# Patient Record
Sex: Male | Born: 1965
Health system: Southern US, Community
[De-identification: ages and names within clinical notes are randomized; demographics above are authoritative.]

## PROBLEM LIST (undated history)

## (undated) DIAGNOSIS — I2510 Atherosclerotic heart disease of native coronary artery without angina pectoris: Secondary | ICD-10-CM

## (undated) HISTORY — PX: HERNIA REPAIR: SHX51

## (undated) HISTORY — DX: Atherosclerotic heart disease of native coronary artery without angina pectoris: I25.10

## (undated) HISTORY — PX: MANDIBLE FRACTURE SURGERY: SHX706

---

## 2000-06-12 ENCOUNTER — Ambulatory Visit (HOSPITAL_COMMUNITY): Admission: RE | Admit: 2000-06-12 | Discharge: 2000-06-12 | Payer: Self-pay

## 2003-10-11 ENCOUNTER — Emergency Department (HOSPITAL_COMMUNITY): Admission: AD | Admit: 2003-10-11 | Discharge: 2003-10-11 | Payer: Self-pay | Admitting: Family Medicine

## 2003-12-30 ENCOUNTER — Encounter: Admission: RE | Admit: 2003-12-30 | Discharge: 2003-12-30 | Payer: Self-pay | Admitting: Orthopedic Surgery

## 2006-01-19 ENCOUNTER — Emergency Department (HOSPITAL_COMMUNITY): Admission: EM | Admit: 2006-01-19 | Discharge: 2006-01-19 | Payer: Self-pay | Admitting: Family Medicine

## 2006-02-07 ENCOUNTER — Emergency Department (HOSPITAL_COMMUNITY): Admission: EM | Admit: 2006-02-07 | Discharge: 2006-02-07 | Payer: Self-pay | Admitting: Emergency Medicine

## 2006-06-20 ENCOUNTER — Emergency Department (HOSPITAL_COMMUNITY): Admission: EM | Admit: 2006-06-20 | Discharge: 2006-06-20 | Payer: Self-pay | Admitting: Family Medicine

## 2007-08-09 ENCOUNTER — Emergency Department (HOSPITAL_COMMUNITY): Admission: EM | Admit: 2007-08-09 | Discharge: 2007-08-09 | Payer: Self-pay | Admitting: Family Medicine

## 2008-09-12 ENCOUNTER — Emergency Department (HOSPITAL_COMMUNITY): Admission: EM | Admit: 2008-09-12 | Discharge: 2008-09-12 | Payer: Self-pay | Admitting: Emergency Medicine

## 2013-08-15 ENCOUNTER — Encounter (HOSPITAL_COMMUNITY): Payer: Self-pay | Admitting: Emergency Medicine

## 2013-08-15 ENCOUNTER — Emergency Department (INDEPENDENT_AMBULATORY_CARE_PROVIDER_SITE_OTHER)
Admission: EM | Admit: 2013-08-15 | Discharge: 2013-08-15 | Disposition: A | Discharge status: Home / Self Care | Payer: BC Managed Care – PPO | Source: Home / Self Care | Attending: Emergency Medicine | Admitting: Emergency Medicine

## 2013-08-15 DIAGNOSIS — J069 Acute upper respiratory infection, unspecified: Secondary | ICD-10-CM

## 2013-08-15 LAB — GLUCOSE, CAPILLARY: Glucose-Capillary: 87 mg/dL (ref 70–99)

## 2013-08-15 MED ORDER — IPRATROPIUM BROMIDE 0.06 % NA SOLN
2.0000 | Freq: Four times a day (QID) | NASAL | Status: DC
Start: 2013-08-15 — End: 2024-04-03

## 2013-08-15 NOTE — ED Notes (Signed)
Thursday noticed tickle in throat, sinus drainage, not feeling good, increased sleepinee, little coughing, "feels bad", sinus congestion, weakness, achy

## 2013-08-15 NOTE — ED Provider Notes (Signed)
CSN: [id number]     Arrival date & time 08/15/13  1055 History   First MD Initiated Contact with Patient 08/15/13 1155     Chief Complaint  Patient presents with  . URI     (Consider location/radiation/quality/duration/timing/severity/associated sxs/prior Treatment) HPI Comments: Patient presents with 5-7 day history of what he states began as a "tickle in my throat." Has had nasal congestion, occasional myalgias, generalized malaise, and mild non-productive cough over past several days. Denies fever, nausea, vomiting, diarrhea, dyspnea, rash. States he has had contact with multiple individuals both at home and at work who have been ill with same symptoms. Did receive 2014-2015 flu shot. Is a non-smoker. Denies uriniary symptoms, polyuria, polydipsia or unexplained weight loss. No sinus pain, no headaches.   The history is provided by the patient.    History reviewed. No pertinent past medical history. Past Surgical History  Procedure Laterality Date  . Mandible fracture surgery    . Hernia repair     No family history on file. History  Substance Use Topics  . Smoking status: Never Smoker   . Smokeless tobacco: Not on file  . Alcohol Use: Yes    Review of Systems  All other systems reviewed and are negative.      Allergies  Review of patient's allergies indicates no known allergies.  Home Medications  No current outpatient prescriptions on file. BP 123/77  Pulse 73  Temp(Src) 98.3 F (36.8 C) (Oral)  Resp 18  SpO2 95% Physical Exam  Nursing note and vitals reviewed. Constitutional: He is oriented to person, place, and time. He appears well-developed and well-nourished. No distress.  HENT:  Head: Normocephalic and atraumatic.  Right Ear: Hearing, tympanic membrane, external ear and ear canal normal.  Left Ear: Hearing, tympanic membrane, external ear and ear canal normal.  Nose: Nose normal.  Mouth/Throat: Uvula is midline, oropharynx is clear and moist and  mucous membranes are normal.  Eyes: Conjunctivae are normal. Right eye exhibits no discharge. Left eye exhibits no discharge. No scleral icterus.  Neck: Normal range of motion. Neck supple.  Cardiovascular: Normal rate, regular rhythm and normal heart sounds.   Pulmonary/Chest: Breath sounds normal.  Abdominal: Soft. Bowel sounds are normal. He exhibits no distension. There is no tenderness.  Musculoskeletal: Normal range of motion.  Lymphadenopathy:    He has no cervical adenopathy.  Neurological: He is alert and oriented to person, place, and time.  Skin: Skin is warm and dry.  Psychiatric: He has a normal mood and affect. His behavior is normal.    ED Course  Procedures (including critical care time) Labs Review Labs Reviewed - No data to display Imaging Review No results found.    MDM   Final diagnoses:  None  Exam unremarkable, vital signs normal and CBG normal. Will provide Rx for atrovent nasal spray for nasal congestion and advise PCP follow up if additional symptoms arise or if fatigue persists.   Jess BartersJennifer Lee OsinoPresson, GeorgiaPA 08/15/13 1245

## 2013-08-15 NOTE — Discharge Instructions (Signed)
Antibiotic Nonuse  Your caregiver felt that the infection or problem was not one that would be helped with an antibiotic. Infections may be caused by viruses or bacteria. Only a caregiver can tell which one of these is the likely cause of an illness. A cold is the most common cause of infection in both adults and children. A cold is a virus. Antibiotic treatment will have no effect on a viral infection. Viruses can lead to many lost days of work caring for sick children and many missed days of school. Children may catch as many as 10 "colds" or "flus" per year during which they can be tearful, cranky, and uncomfortable. The goal of treating a virus is aimed at keeping the ill person comfortable. Antibiotics are medications used to help the body fight bacterial infections. There are relatively few types of bacteria that cause infections but there are hundreds of viruses. While both viruses and bacteria cause infection they are very different types of germs. A viral infection will typically go away by itself within 7 to 10 days. Bacterial infections may spread or get worse without antibiotic treatment. Examples of bacterial infections are:  Sore throats (like strep throat or tonsillitis).  Infection in the lung (pneumonia).  Ear and skin infections. Examples of viral infections are:  Colds or flus.  Most coughs and bronchitis.  Sore throats not caused by Strep.  Runny noses. It is often best not to take an antibiotic when a viral infection is the cause of the problem. Antibiotics can kill off the helpful bacteria that we have inside our body and allow harmful bacteria to start growing. Antibiotics can cause side effects such as allergies, nausea, and diarrhea without helping to improve the symptoms of the viral infection. Additionally, repeated uses of antibiotics can cause bacteria inside of our body to become resistant. That resistance can be passed onto harmful bacterial. The next time you have  an infection it may be harder to treat if antibiotics are used when they are not needed. Not treating with antibiotics allows our own immune system to develop and take care of infections more efficiently. Also, antibiotics will work better for us when they are prescribed for bacterial infections. Treatments for a child that is ill may include:  Give extra fluids throughout the day to stay hydrated.  Get plenty of rest.  Only give your child over-the-counter or prescription medicines for pain, discomfort, or fever as directed by your caregiver.  The use of a cool mist humidifier may help stuffy noses.  Cold medications if suggested by your caregiver. Your caregiver may decide to start you on an antibiotic if:  The problem you were seen for today continues for a longer length of time than expected.  You develop a secondary bacterial infection. SEEK MEDICAL CARE IF:  Fever lasts longer than 5 days.  Symptoms continue to get worse after 5 to 7 days or become severe.  Difficulty in breathing develops.  Signs of dehydration develop (poor drinking, rare urinating, dark colored urine).  Changes in behavior or worsening tiredness (listlessness or lethargy). Document Released: 08/29/2001 Document Revised: 09/12/2011 Document Reviewed: 02/25/2009 [facility] Patient Information 2014 [place], [place].  Your vital signs, blood sugar and examination were all normal. I would advise using nasal spray as prescribed for your congestion. If you continue to feel fatigued or develop new or worsening symptoms over the next 7-10 days, please contact your primary care doctor for re-evaluation.

## 2013-08-16 NOTE — ED Provider Notes (Signed)
Medical screening examination/treatment/procedure(s) were performed by non-physician practitioner and as supervising physician I was immediately available for consultation/collaboration.  Leslee Homeavid Rolen Conger, M.D.  Reuben Likesavid C Dariane Natzke, MD 08/16/13 1155

## 2015-12-04 DIAGNOSIS — H5213 Myopia, bilateral: Secondary | ICD-10-CM | POA: Diagnosis not present

## 2016-07-13 DIAGNOSIS — Z0000 Encounter for general adult medical examination without abnormal findings: Secondary | ICD-10-CM | POA: Diagnosis not present

## 2016-07-13 DIAGNOSIS — E041 Nontoxic single thyroid nodule: Secondary | ICD-10-CM | POA: Diagnosis not present

## 2016-07-13 DIAGNOSIS — Z125 Encounter for screening for malignant neoplasm of prostate: Secondary | ICD-10-CM | POA: Diagnosis not present

## 2016-07-20 DIAGNOSIS — E041 Nontoxic single thyroid nodule: Secondary | ICD-10-CM | POA: Diagnosis not present

## 2016-07-20 DIAGNOSIS — Z125 Encounter for screening for malignant neoplasm of prostate: Secondary | ICD-10-CM | POA: Diagnosis not present

## 2016-07-20 DIAGNOSIS — M542 Cervicalgia: Secondary | ICD-10-CM | POA: Diagnosis not present

## 2016-07-20 DIAGNOSIS — Z1389 Encounter for screening for other disorder: Secondary | ICD-10-CM | POA: Diagnosis not present

## 2016-07-20 DIAGNOSIS — E784 Other hyperlipidemia: Secondary | ICD-10-CM | POA: Diagnosis not present

## 2016-07-20 DIAGNOSIS — R946 Abnormal results of thyroid function studies: Secondary | ICD-10-CM | POA: Diagnosis not present

## 2016-07-20 DIAGNOSIS — Z0000 Encounter for general adult medical examination without abnormal findings: Secondary | ICD-10-CM | POA: Diagnosis not present

## 2016-07-22 ENCOUNTER — Other Ambulatory Visit: Payer: Self-pay | Admitting: Internal Medicine

## 2016-07-22 DIAGNOSIS — E041 Nontoxic single thyroid nodule: Secondary | ICD-10-CM

## 2017-07-18 DIAGNOSIS — E041 Nontoxic single thyroid nodule: Secondary | ICD-10-CM | POA: Diagnosis not present

## 2017-07-18 DIAGNOSIS — Z125 Encounter for screening for malignant neoplasm of prostate: Secondary | ICD-10-CM | POA: Diagnosis not present

## 2017-07-18 DIAGNOSIS — E7849 Other hyperlipidemia: Secondary | ICD-10-CM | POA: Diagnosis not present

## 2017-07-21 DIAGNOSIS — E041 Nontoxic single thyroid nodule: Secondary | ICD-10-CM | POA: Diagnosis not present

## 2017-07-21 DIAGNOSIS — Z23 Encounter for immunization: Secondary | ICD-10-CM | POA: Diagnosis not present

## 2017-07-21 DIAGNOSIS — Z1389 Encounter for screening for other disorder: Secondary | ICD-10-CM | POA: Diagnosis not present

## 2017-07-21 DIAGNOSIS — E7849 Other hyperlipidemia: Secondary | ICD-10-CM | POA: Diagnosis not present

## 2017-07-21 DIAGNOSIS — Z0000 Encounter for general adult medical examination without abnormal findings: Secondary | ICD-10-CM | POA: Diagnosis not present

## 2017-07-21 DIAGNOSIS — R946 Abnormal results of thyroid function studies: Secondary | ICD-10-CM | POA: Diagnosis not present

## 2017-07-21 DIAGNOSIS — R740 Nonspecific elevation of levels of transaminase and lactic acid dehydrogenase [LDH]: Secondary | ICD-10-CM | POA: Diagnosis not present

## 2017-07-22 ENCOUNTER — Other Ambulatory Visit: Payer: Self-pay | Admitting: Internal Medicine

## 2017-07-22 DIAGNOSIS — E785 Hyperlipidemia, unspecified: Secondary | ICD-10-CM

## 2017-07-28 DIAGNOSIS — Z1212 Encounter for screening for malignant neoplasm of rectum: Secondary | ICD-10-CM | POA: Diagnosis not present

## 2017-12-05 DIAGNOSIS — H5213 Myopia, bilateral: Secondary | ICD-10-CM | POA: Diagnosis not present

## 2017-12-26 ENCOUNTER — Encounter: Payer: Self-pay | Admitting: Internal Medicine

## 2018-03-07 ENCOUNTER — Encounter: Payer: Self-pay | Admitting: Internal Medicine

## 2018-03-30 ENCOUNTER — Encounter: Payer: Self-pay | Admitting: Internal Medicine

## 2018-03-30 ENCOUNTER — Ambulatory Visit (AMBULATORY_SURGERY_CENTER): Payer: Self-pay | Admitting: *Deleted

## 2018-03-30 VITALS — Ht 71.5 in | Wt 216.0 lb

## 2018-03-30 DIAGNOSIS — Z1211 Encounter for screening for malignant neoplasm of colon: Secondary | ICD-10-CM

## 2018-03-30 MED ORDER — NA SULFATE-K SULFATE-MG SULF 17.5-3.13-1.6 GM/177ML PO SOLN: 1.0000 | Freq: Once | ORAL | 0 refills | Status: AC

## 2018-03-30 NOTE — Progress Notes (Signed)
No egg or soy allergy known to patient  No issues with past sedation with any surgeries  or procedures, no intubation problems  No diet pills per patient No home 02 use per patient  No blood thinners per patient  Pt denies issues with constipation  No A fib or A flutter  EMMI video sent to pt's e mail  Pt dclined  PNM than $50 coupon to pt in PV today for Suprep

## 2018-04-11 ENCOUNTER — Ambulatory Visit (AMBULATORY_SURGERY_CENTER): Payer: BLUE CROSS/BLUE SHIELD | Admitting: Internal Medicine

## 2018-04-11 ENCOUNTER — Encounter: Payer: Self-pay | Admitting: Internal Medicine

## 2018-04-11 VITALS — BP 112/72 | HR 66 | Temp 98.0°F | Resp 14 | Ht 71.5 in | Wt 216.0 lb

## 2018-04-11 DIAGNOSIS — Z1211 Encounter for screening for malignant neoplasm of colon: Secondary | ICD-10-CM

## 2018-04-11 MED ORDER — SODIUM CHLORIDE 0.9 % IV SOLN: 500.0000 mL | Freq: Once | INTRAVENOUS | Status: DC

## 2018-04-11 NOTE — Progress Notes (Signed)
Pt's states no medical or surgical changes since previsit or office visit. 

## 2018-04-11 NOTE — Progress Notes (Signed)
Report to PACU, RN, vss, BBS= Clear.  

## 2018-04-11 NOTE — Op Note (Signed)
[facility] Patient Name: Rick Smith Procedure Date: 04/11/2018 3:19 PM MRN: [id number] Endoscopist: Wilhemina Bonito. Marina Goodell , MD Age: 52 Referring MD:  Date of Birth: 01-29-1966 Gender: Male Account #: [id number] Procedure:                Colonoscopy Indications:              Screening for colorectal malignant neoplasm Medicines:                Monitored Anesthesia Care Procedure:                Pre-Anesthesia Assessment:                           - Prior to the procedure, a History and Physical                            was performed, and patient medications and                            allergies were reviewed. The patient's tolerance of                            previous anesthesia was also reviewed. The risks                            and benefits of the procedure and the sedation                            options and risks were discussed with the patient.                            All questions were answered, and informed consent                            was obtained. Prior Anticoagulants: The patient has                            taken no previous anticoagulant or antiplatelet                            agents. ASA Grade Assessment: I - A normal, healthy                            patient. After reviewing the risks and benefits,                            the patient was deemed in satisfactory condition to                            undergo the procedure.                           After obtaining informed consent, the colonoscope  was passed under direct vision. Throughout the                            procedure, the patient's blood pressure, pulse, and                            oxygen saturations were monitored continuously. The                            Colonoscope was introduced through the anus and                            advanced to the the cecum, identified by                            appendiceal orifice and ileocecal valve. The                             ileocecal valve, appendiceal orifice, and rectum                            were photographed. The quality of the bowel                            preparation was excellent. The colonoscopy was                            performed without difficulty. The patient tolerated                            the procedure well. The bowel preparation used was                            SUPREP. Scope In: 3:27:56 PM Scope Out: 3:41:20 PM Scope Withdrawal Time: 0 hours 10 minutes 48 seconds  Total Procedure Duration: 0 hours 13 minutes 24 seconds  Findings:                 Multiple diverticula were found in the sigmoid                            colon.                           The exam was otherwise without abnormality on                            direct and retroflexion views. Complications:            No immediate complications. Estimated blood loss:                            None. Estimated Blood Loss:     Estimated blood loss: none. Impression:               - Diverticulosis in the sigmoid colon.                           -  The examination was otherwise normal on direct                            and retroflexion views.                           - No specimens collected. Recommendation:           - Repeat colonoscopy in 10 years for screening                            purposes.                           - Patient has a contact number available for                            emergencies. The signs and symptoms of potential                            delayed complications were discussed with the                            patient. Return to normal activities tomorrow.                            Written discharge instructions were provided to the                            patient.                           - Resume previous diet.                           - Continue present medications. Wilhemina Bonito. Marina Goodell, MD 04/11/2018 3:45:44 PM This report has been signed  electronically.

## 2018-04-11 NOTE — Patient Instructions (Signed)
HANDOUT GIVEN FOR DIVERTICULOSIS  YOU HAD AN ENDOSCOPIC PROCEDURE TODAY AT THE [facility]:   Refer to the procedure report that was given to you for any specific questions about what was found during the examination.  If the procedure report does not answer your questions, please call your gastroenterologist to clarify.  If you requested that your care partner not be given the details of your procedure findings, then the procedure report has been included in a sealed envelope for you to review at your convenience later.  YOU SHOULD EXPECT: Some feelings of bloating in the abdomen. Passage of more gas than usual.  Walking can help get rid of the air that was put into your GI tract during the procedure and reduce the bloating. If you had a lower endoscopy (such as a colonoscopy or flexible sigmoidoscopy) you may notice spotting of blood in your stool or on the toilet paper. If you underwent a bowel prep for your procedure, you may not have a normal bowel movement for a few days.  Please Note:  You might notice some irritation and congestion in your nose or some drainage.  This is from the oxygen used during your procedure.  There is no need for concern and it should clear up in a day or so.  SYMPTOMS TO REPORT IMMEDIATELY:   Following lower endoscopy (colonoscopy or flexible sigmoidoscopy):  Excessive amounts of blood in the stool  Significant tenderness or worsening of abdominal pains  Swelling of the abdomen that is new, acute  Fever of 100F or higher   For urgent or emergent issues, a gastroenterologist can be reached at any hour by calling [phone].   DIET:  We do recommend a small meal at first, but then you may proceed to your regular diet.  Drink plenty of fluids but you should avoid alcoholic beverages for 24 hours.  ACTIVITY:  You should plan to take it easy for the rest of today and you should NOT DRIVE or use heavy machinery until tomorrow (because of the sedation  medicines used during the test).    FOLLOW UP: Our staff will call the number listed on your records the next business day following your procedure to check on you and address any questions or concerns that you may have regarding the information given to you following your procedure. If we do not reach you, we will leave a message.  However, if you are feeling well and you are not experiencing any problems, there is no need to return our call.  We will assume that you have returned to your regular daily activities without incident.  If any biopsies were taken you will be contacted by phone or by letter within the next 1-3 weeks.  Please call us at [phone] if you have not heard about the biopsies in 3 weeks.    SIGNATURES/CONFIDENTIALITY: You and/or your care partner have signed paperwork which will be entered into your electronic medical record.  These signatures attest to the fact that that the information above on your After Visit Summary has been reviewed and is understood.  Full responsibility of the confidentiality of this discharge information lies with you and/or your care-partner. 

## 2018-04-12 ENCOUNTER — Telehealth: Payer: Self-pay

## 2018-04-12 NOTE — Telephone Encounter (Signed)
  Follow up Call-  Call back number 04/11/2018  Post procedure Call Back phone  # [phone]  Permission to leave phone message Yes  Some recent data might be hidden    Tried to leave message but mailbox is full.

## 2018-04-12 NOTE — Telephone Encounter (Signed)
  Follow up Call-  Call back number 04/11/2018  Post procedure Call Back phone  # [phone]  Permission to leave phone message Yes  Some recent data might be hidden     Patient questions:  Do you have a fever, pain , or abdominal swelling? No. Pain Score  0 *  Have you tolerated food without any problems? Yes.    Have you been able to return to your normal activities? Yes.    Do you have any questions about your discharge instructions: Diet   No. Medications  No. Follow up visit  No.  Do you have questions or concerns about your Care? No.  Actions: * If pain score is 4 or above: No action needed, pain <4.

## 2018-07-18 DIAGNOSIS — E041 Nontoxic single thyroid nodule: Secondary | ICD-10-CM | POA: Diagnosis not present

## 2018-07-18 DIAGNOSIS — Z125 Encounter for screening for malignant neoplasm of prostate: Secondary | ICD-10-CM | POA: Diagnosis not present

## 2018-07-18 DIAGNOSIS — Z0000 Encounter for general adult medical examination without abnormal findings: Secondary | ICD-10-CM | POA: Diagnosis not present

## 2018-07-25 DIAGNOSIS — E041 Nontoxic single thyroid nodule: Secondary | ICD-10-CM | POA: Diagnosis not present

## 2018-07-25 DIAGNOSIS — R946 Abnormal results of thyroid function studies: Secondary | ICD-10-CM | POA: Diagnosis not present

## 2018-07-25 DIAGNOSIS — Z0000 Encounter for general adult medical examination without abnormal findings: Secondary | ICD-10-CM | POA: Diagnosis not present

## 2018-07-25 DIAGNOSIS — Z23 Encounter for immunization: Secondary | ICD-10-CM | POA: Diagnosis not present

## 2018-07-25 DIAGNOSIS — R740 Nonspecific elevation of levels of transaminase and lactic acid dehydrogenase [LDH]: Secondary | ICD-10-CM | POA: Diagnosis not present

## 2018-07-25 DIAGNOSIS — E7849 Other hyperlipidemia: Secondary | ICD-10-CM | POA: Diagnosis not present

## 2018-07-25 DIAGNOSIS — Z1331 Encounter for screening for depression: Secondary | ICD-10-CM | POA: Diagnosis not present

## 2018-07-27 ENCOUNTER — Other Ambulatory Visit: Payer: Self-pay | Admitting: Internal Medicine

## 2018-07-27 DIAGNOSIS — E041 Nontoxic single thyroid nodule: Secondary | ICD-10-CM

## 2018-08-02 ENCOUNTER — Ambulatory Visit
Admission: RE | Admit: 2018-08-02 | Discharge: 2018-08-02 | Disposition: A | Discharge status: Home / Self Care | Payer: Self-pay | Source: Ambulatory Visit | Attending: Internal Medicine | Admitting: Internal Medicine

## 2018-08-02 DIAGNOSIS — E785 Hyperlipidemia, unspecified: Secondary | ICD-10-CM

## 2018-08-02 DIAGNOSIS — E041 Nontoxic single thyroid nodule: Secondary | ICD-10-CM

## 2018-08-06 ENCOUNTER — Other Ambulatory Visit: Payer: Self-pay | Admitting: Internal Medicine

## 2018-08-06 DIAGNOSIS — E041 Nontoxic single thyroid nodule: Secondary | ICD-10-CM

## 2018-08-21 ENCOUNTER — Ambulatory Visit
Admission: RE | Admit: 2018-08-21 | Discharge: 2018-08-21 | Disposition: A | Discharge status: Home / Self Care | Payer: BLUE CROSS/BLUE SHIELD | Source: Ambulatory Visit | Attending: Internal Medicine | Admitting: Internal Medicine

## 2018-08-21 ENCOUNTER — Other Ambulatory Visit (HOSPITAL_COMMUNITY)
Admission: RE | Admit: 2018-08-21 | Discharge: 2018-08-21 | Disposition: A | Discharge status: Home / Self Care | Payer: BLUE CROSS/BLUE SHIELD | Source: Ambulatory Visit | Attending: Radiology | Admitting: Radiology

## 2018-08-21 DIAGNOSIS — E041 Nontoxic single thyroid nodule: Secondary | ICD-10-CM

## 2019-02-01 IMAGING — CT CT HEART SCORING
3 series · 14 of 20 positions shown, 16 images · non-contrast
Comparison: None.

CLINICAL DATA: 52-YEAR-OLD MALE.  HYPERLIPIDEMIA.  CAUCASIAN.

EXAM:
CT HEART FOR CALCIUM SCORING
TECHNIQUE: CT heart was performed on a 256 channel system using prospective ECG
gating. A scout and noncontrast exam (for calcium scoring) were
performed. Note that this exam targets the heart and the chest was
not imaged in its entirety.

[Series 2: calcium scoring 2.00 qr36 bestdiast 70% · axial · 0.45mm/px · z∈[+1746,+1818]mm · 4 of 61 slices shown]
[im 13/61  vessel]
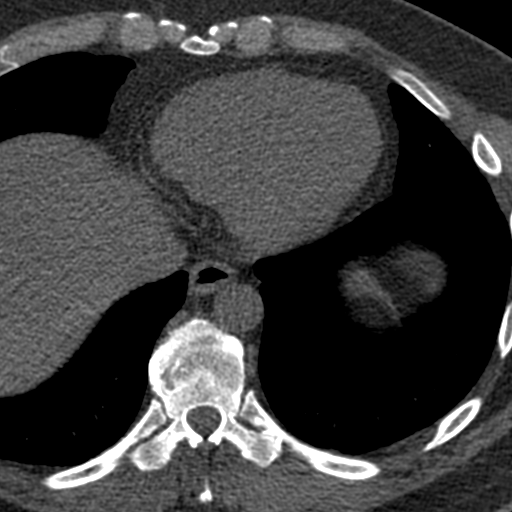
[im 25/61  vessel]
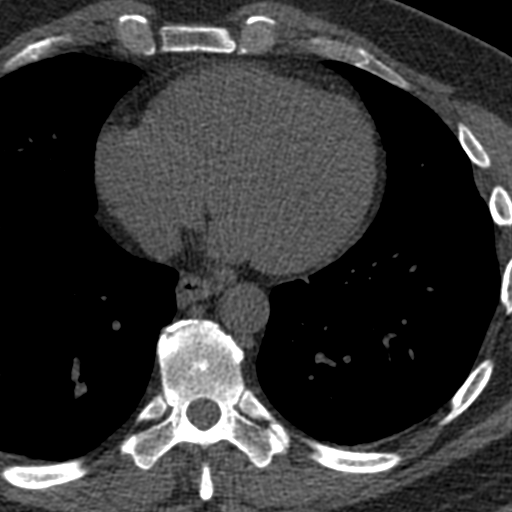
[im 37/61  vessel]
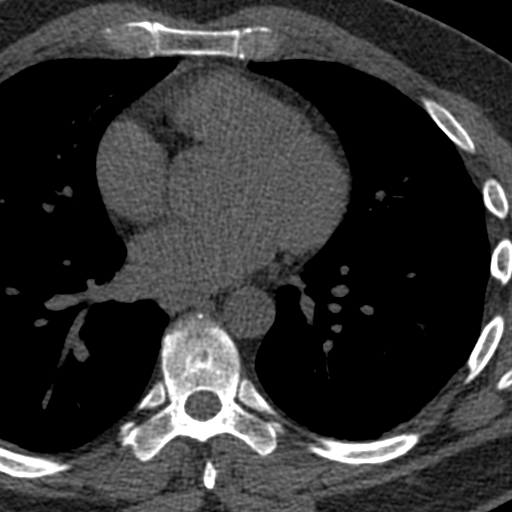
[im 49/61  vessel]
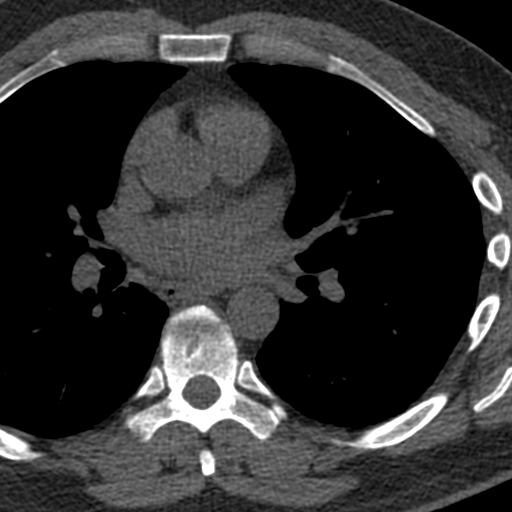

[Series 3: calcium scoring 2.00 br40 bestdiast 70% ax fov · axial · 0.60mm/px · z∈[+1742,+1822]mm · 5 of 61 slices shown, 7 images]
[im 11/61  vessel]
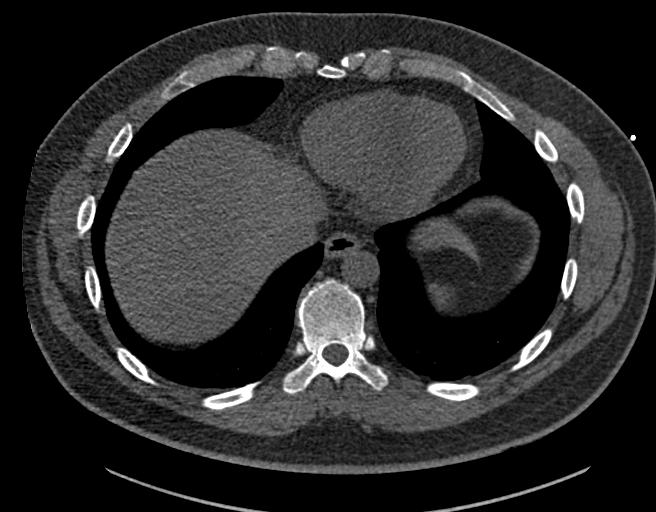
[im 11/61  lung]
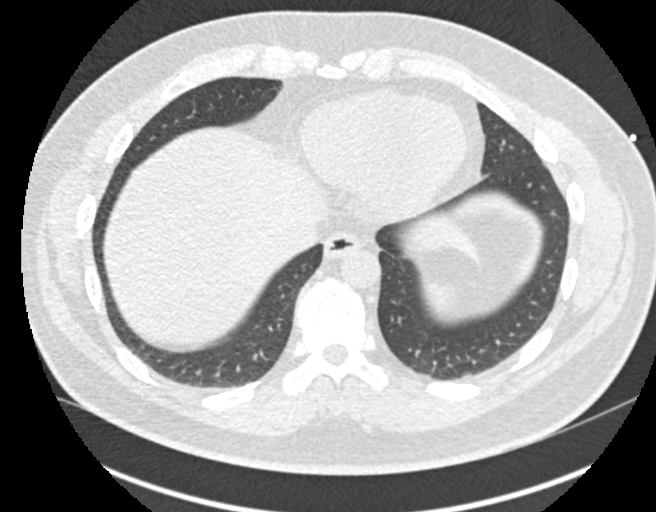
[im 21/61  vessel]
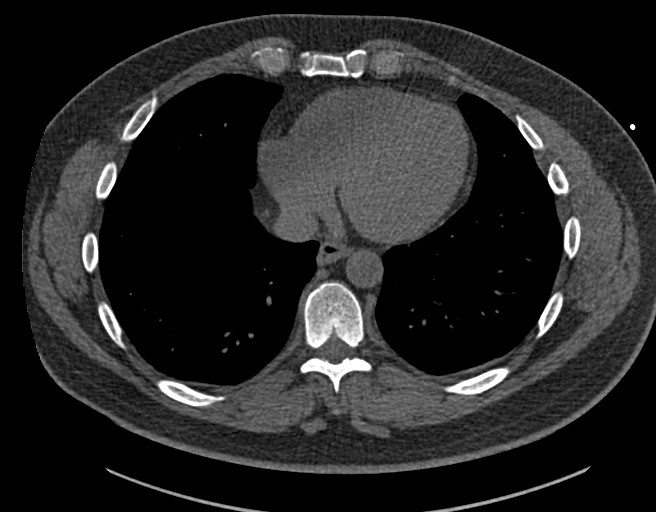
[im 31/61  vessel]
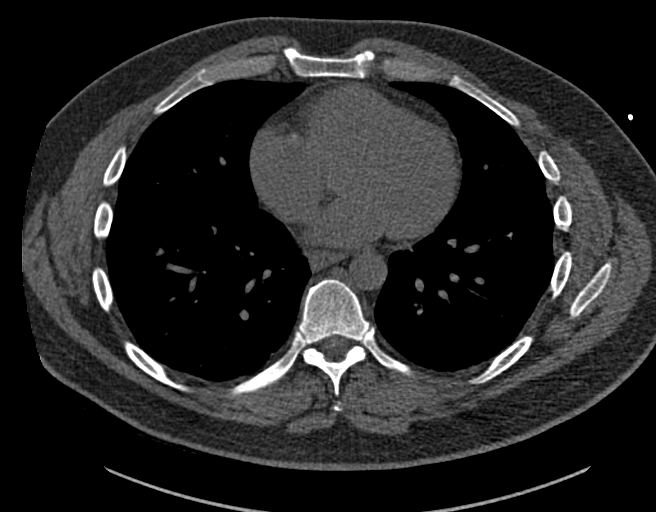
[im 41/61  vessel]
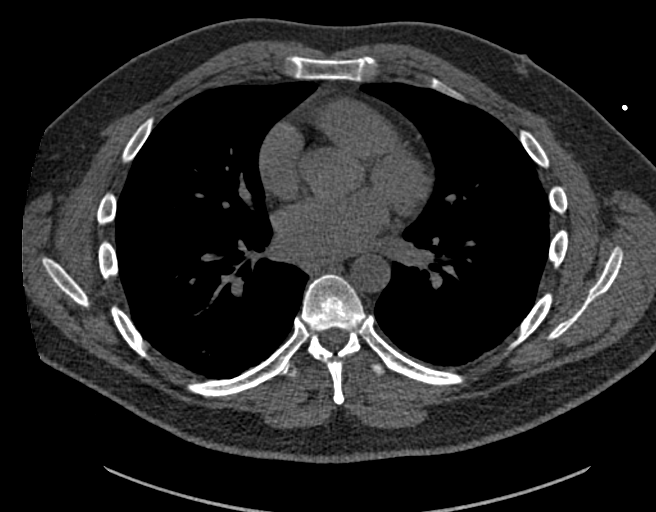
[im 51/61  vessel]
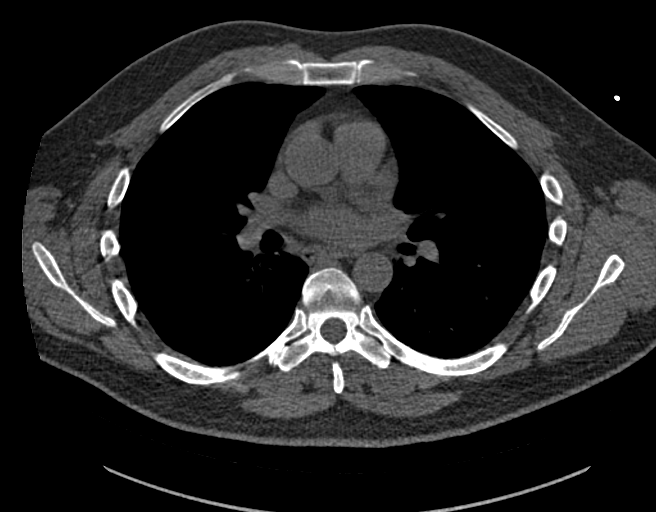
[im 51/61  lung]
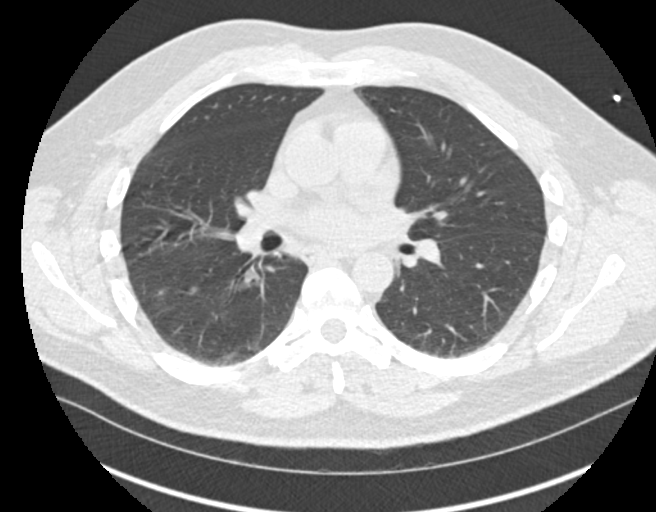

[Series 9: calcium scoring 2.00 br60 bestdiast 70% ax fov · axial · 0.60mm/px · z∈[+1742,+1822]mm · 5 of 60 slices shown]
[im 10/60  vessel]
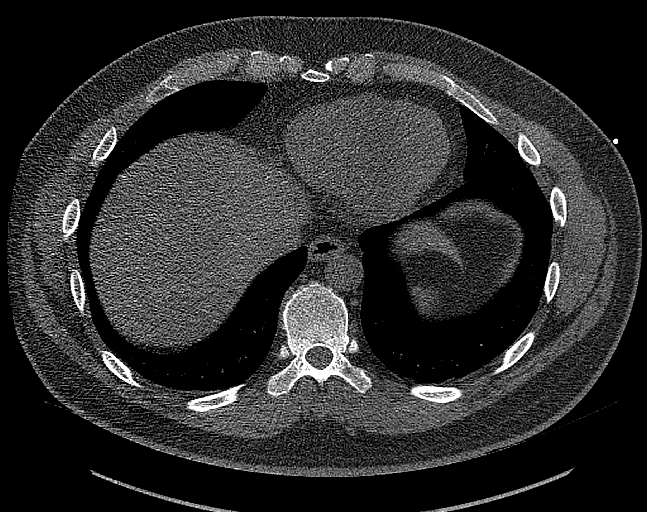
[im 20/60  vessel]
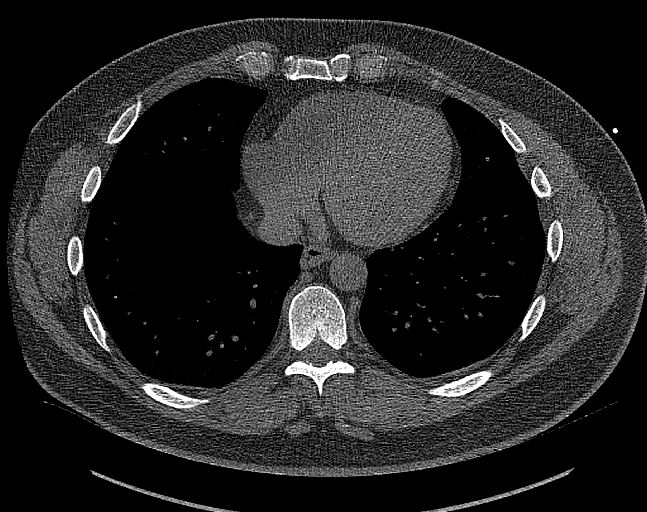
[im 30/60  vessel]
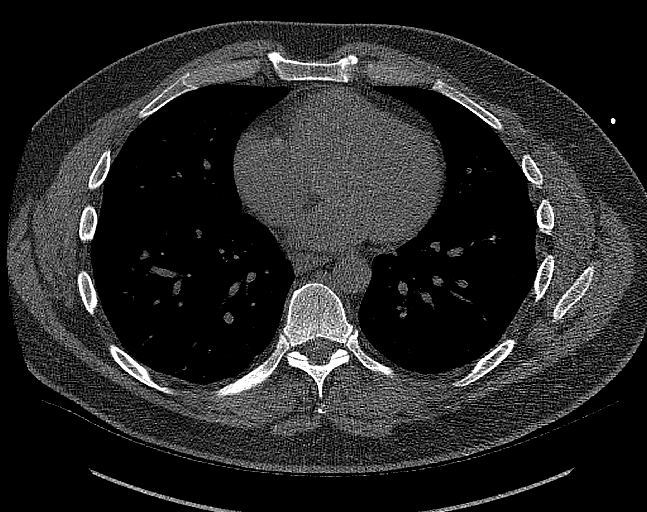
[im 40/60  vessel]
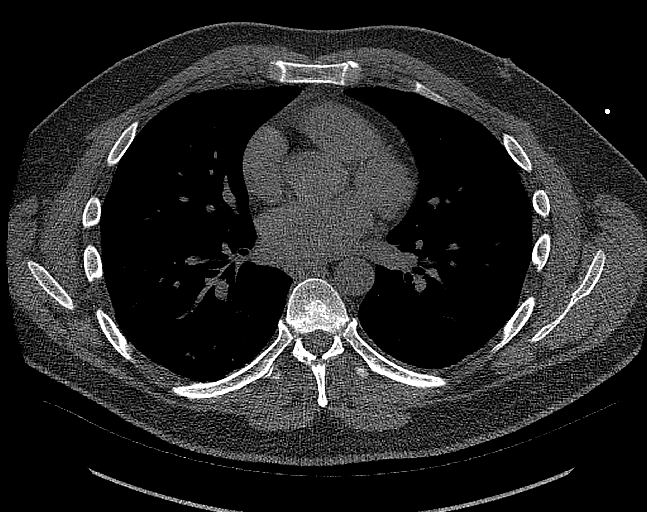
[im 50/60  vessel]
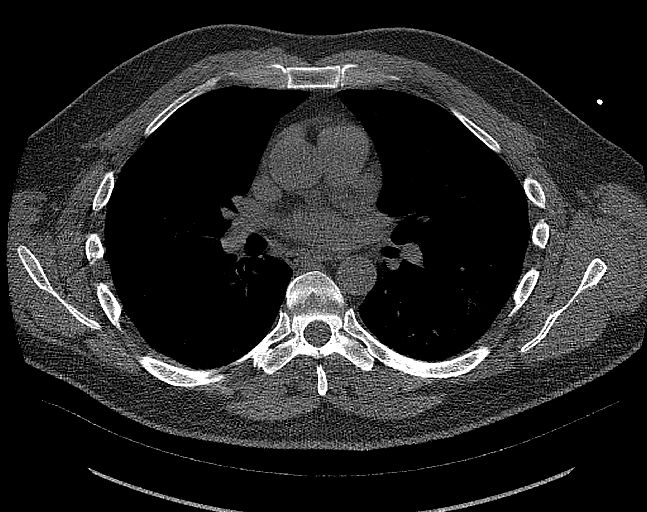

[14 of 20 positions shown; findings below may reference images not displayed]

FINDINGS: Technical quality: Good

Mild LAD coronary artery calcification.

CORONARY CALCIUM

Total Agatston Score:

[HOSPITAL] percentile: 59 th

Ascending aorta ( <  40 mm): 31 mm

EXTRACARDIAC FINDINGS:

Limited view of the lung parenchyma demonstrates no suspicious
nodularity. Airways are normal.

Limited view of the mediastinum demonstrates no adenopathy.
Esophagus normal.

Limited view of the upper abdomen unremarkable.

Limited view of the skeleton and chest wall is unremarkable.
IMPRESSION: 1. Mild LAD coronary artery calcification.

2. Total Agatston Score:

3. MESA age and sex matched database percentile: 59th

## 2019-05-08 DIAGNOSIS — Z209 Contact with and (suspected) exposure to unspecified communicable disease: Secondary | ICD-10-CM | POA: Diagnosis not present

## 2019-07-24 DIAGNOSIS — L820 Inflamed seborrheic keratosis: Secondary | ICD-10-CM | POA: Diagnosis not present

## 2019-07-24 DIAGNOSIS — L821 Other seborrheic keratosis: Secondary | ICD-10-CM | POA: Diagnosis not present

## 2019-07-24 DIAGNOSIS — L814 Other melanin hyperpigmentation: Secondary | ICD-10-CM | POA: Diagnosis not present

## 2019-07-24 DIAGNOSIS — D225 Melanocytic nevi of trunk: Secondary | ICD-10-CM | POA: Diagnosis not present

## 2019-07-29 DIAGNOSIS — Z125 Encounter for screening for malignant neoplasm of prostate: Secondary | ICD-10-CM | POA: Diagnosis not present

## 2019-07-29 DIAGNOSIS — E7849 Other hyperlipidemia: Secondary | ICD-10-CM | POA: Diagnosis not present

## 2019-07-29 DIAGNOSIS — E041 Nontoxic single thyroid nodule: Secondary | ICD-10-CM | POA: Diagnosis not present

## 2019-07-29 DIAGNOSIS — Z0000 Encounter for general adult medical examination without abnormal findings: Secondary | ICD-10-CM | POA: Diagnosis not present

## 2019-07-30 DIAGNOSIS — Z1212 Encounter for screening for malignant neoplasm of rectum: Secondary | ICD-10-CM | POA: Diagnosis not present

## 2019-07-30 DIAGNOSIS — R82998 Other abnormal findings in urine: Secondary | ICD-10-CM | POA: Diagnosis not present

## 2019-08-05 DIAGNOSIS — R7401 Elevation of levels of liver transaminase levels: Secondary | ICD-10-CM | POA: Diagnosis not present

## 2019-08-05 DIAGNOSIS — E785 Hyperlipidemia, unspecified: Secondary | ICD-10-CM | POA: Diagnosis not present

## 2019-08-05 DIAGNOSIS — Z0000 Encounter for general adult medical examination without abnormal findings: Secondary | ICD-10-CM | POA: Diagnosis not present

## 2019-08-05 DIAGNOSIS — E041 Nontoxic single thyroid nodule: Secondary | ICD-10-CM | POA: Diagnosis not present

## 2019-08-05 DIAGNOSIS — N529 Male erectile dysfunction, unspecified: Secondary | ICD-10-CM | POA: Diagnosis not present

## 2019-08-05 DIAGNOSIS — Z1331 Encounter for screening for depression: Secondary | ICD-10-CM | POA: Diagnosis not present

## 2019-08-08 ENCOUNTER — Other Ambulatory Visit: Payer: Self-pay | Admitting: Internal Medicine

## 2019-08-08 DIAGNOSIS — E041 Nontoxic single thyroid nodule: Secondary | ICD-10-CM

## 2019-08-13 ENCOUNTER — Ambulatory Visit
Admission: RE | Admit: 2019-08-13 | Discharge: 2019-08-13 | Disposition: A | Discharge status: Home / Self Care | Payer: BLUE CROSS/BLUE SHIELD | Source: Ambulatory Visit | Attending: Internal Medicine | Admitting: Internal Medicine

## 2019-08-13 DIAGNOSIS — E042 Nontoxic multinodular goiter: Secondary | ICD-10-CM | POA: Diagnosis not present

## 2019-08-13 DIAGNOSIS — E041 Nontoxic single thyroid nodule: Secondary | ICD-10-CM

## 2020-02-12 IMAGING — US US THYROID
1 series · 13 of 25 positions shown · non-contrast
Comparison: 08/02/2018

CLINICAL DATA: Nodule. Previous FNA biopsy of inferior right nodule
08/21/2018.

EXAM:
THYROID ULTRASOUND
TECHNIQUE: Ultrasound examination of the thyroid gland and adjacent soft
tissues was performed.

[Series 1: us thyroid · 0.07mm/px · 13 of 38 slices shown]
[im 1/38]
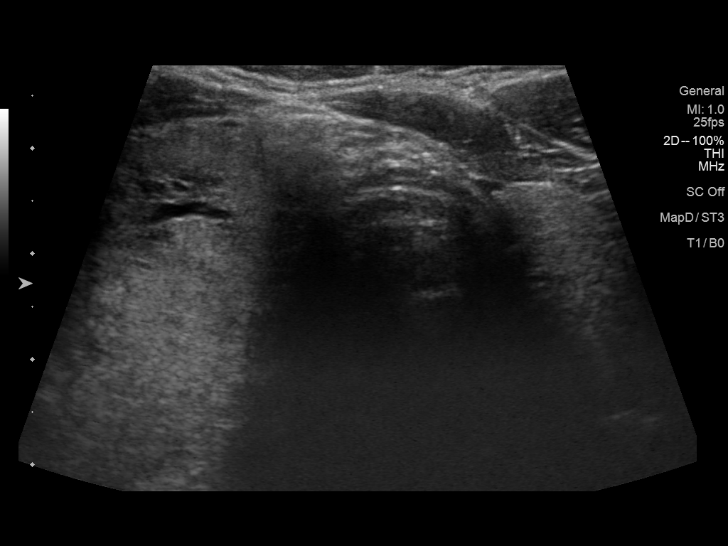
[im 4/38]
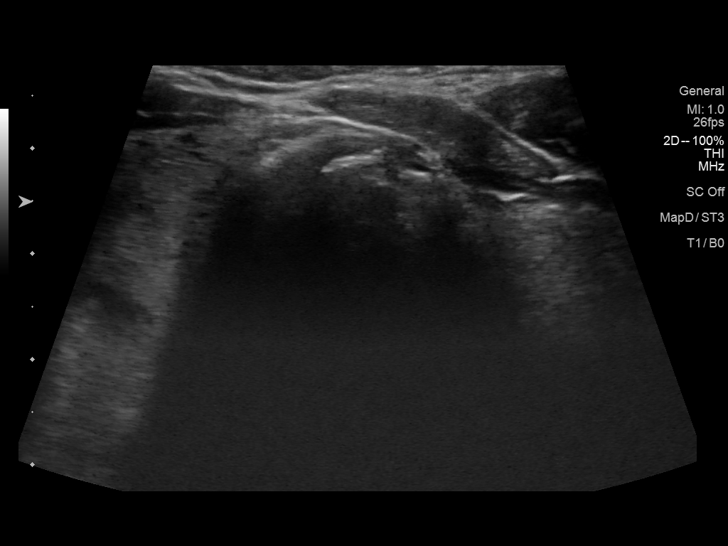
[im 7/38]
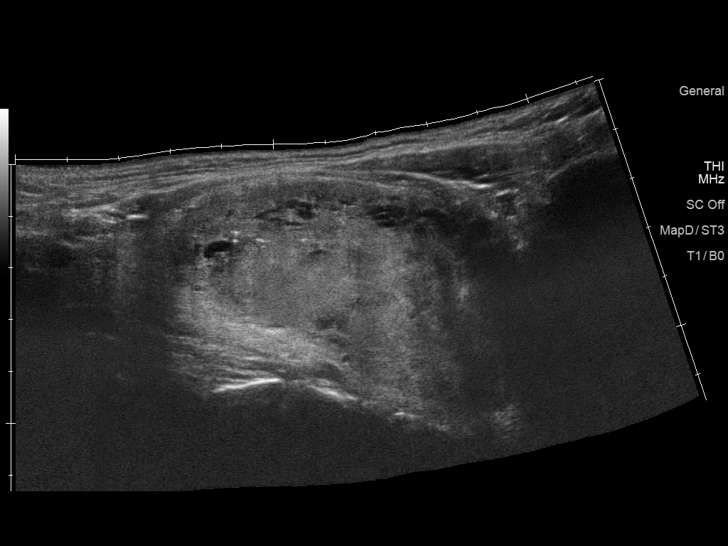
[im 10/38]
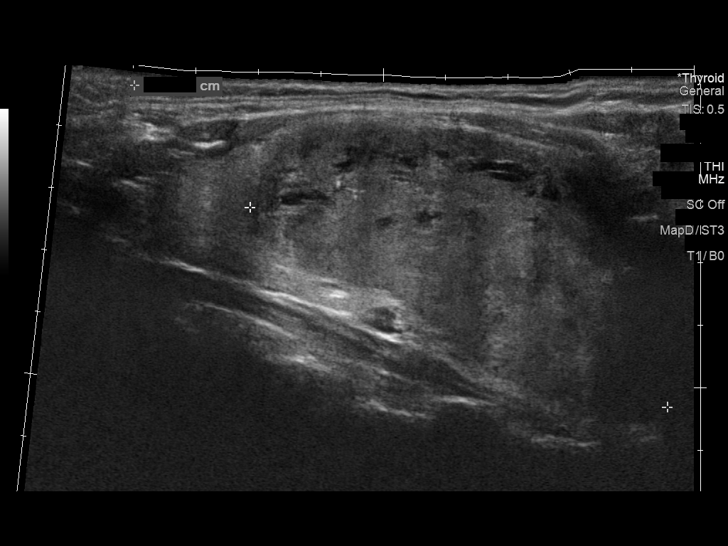
[im 13/38]
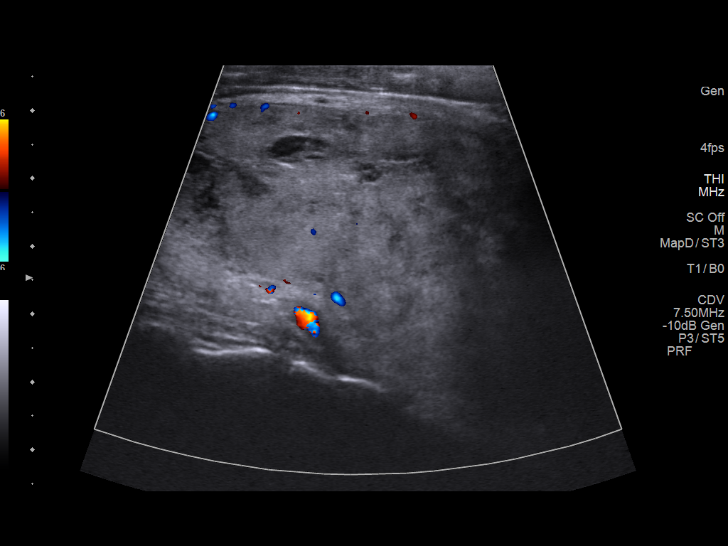
[im 16/38]
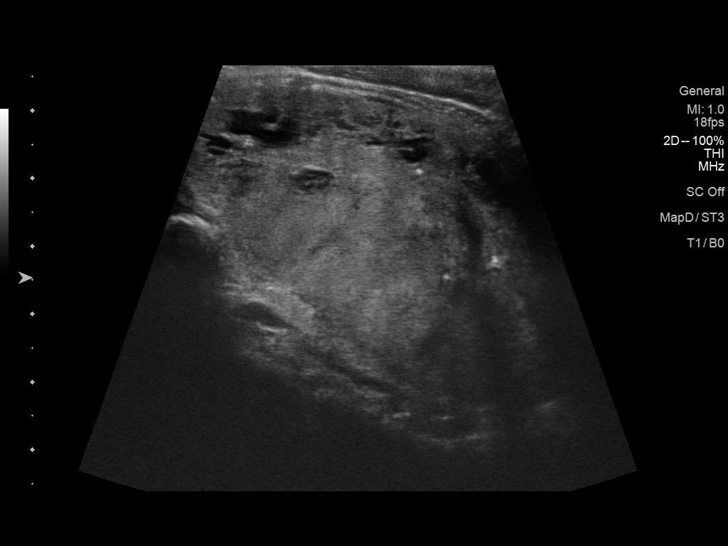
[im 19/38]
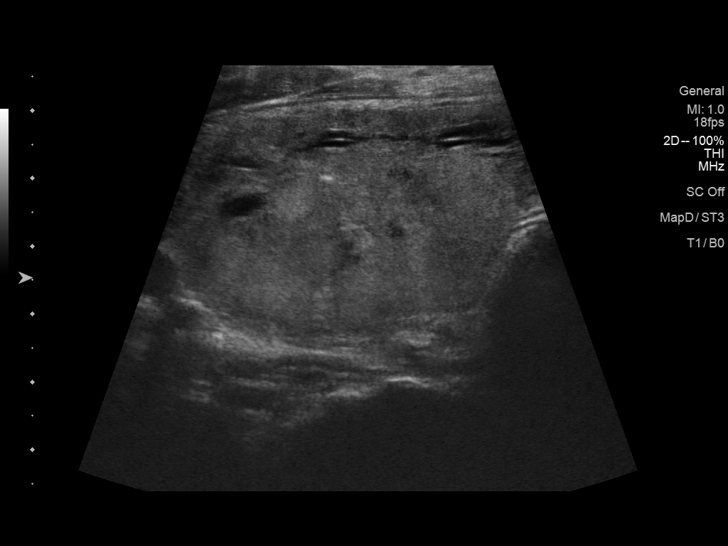
[im 22/38]
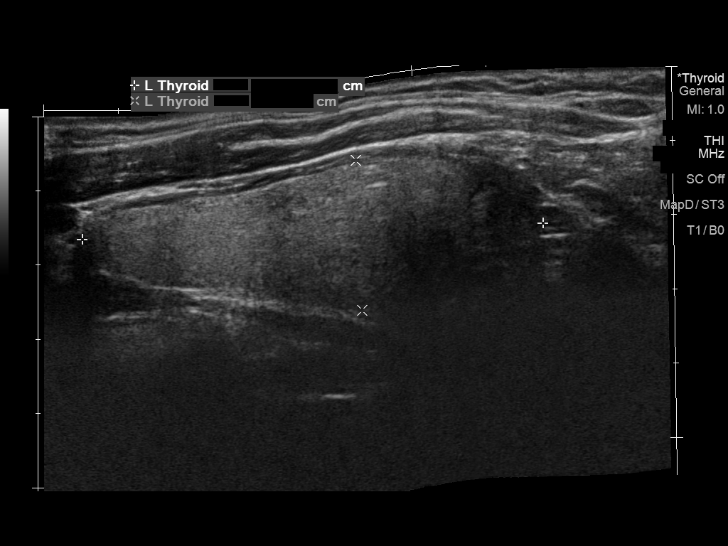
[im 25/38]
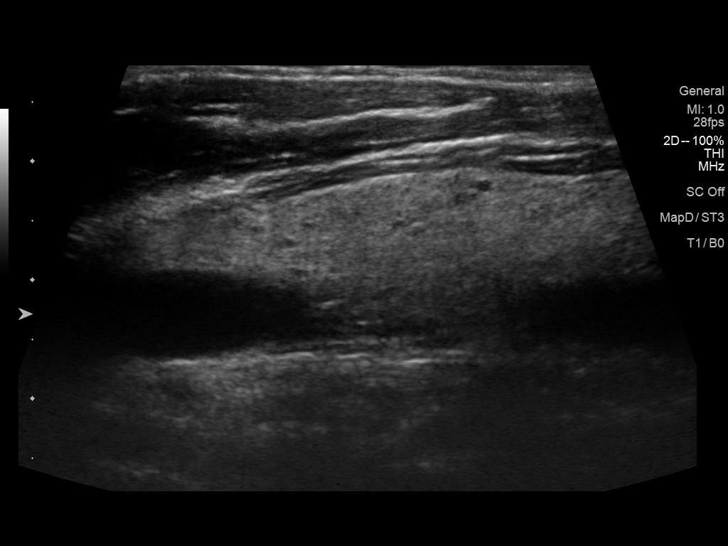
[im 28/38]
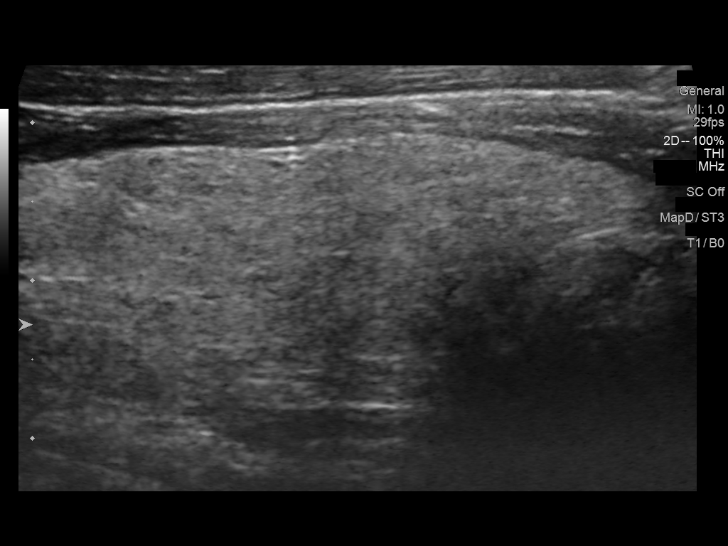
[im 31/38]
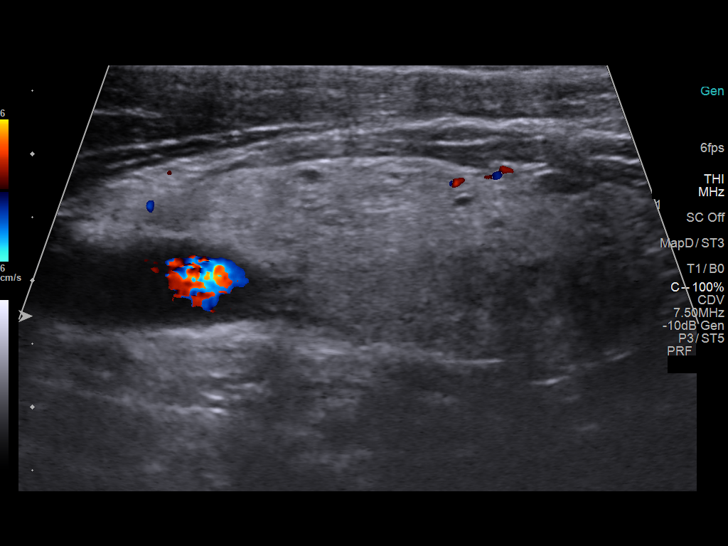
[im 34/38]
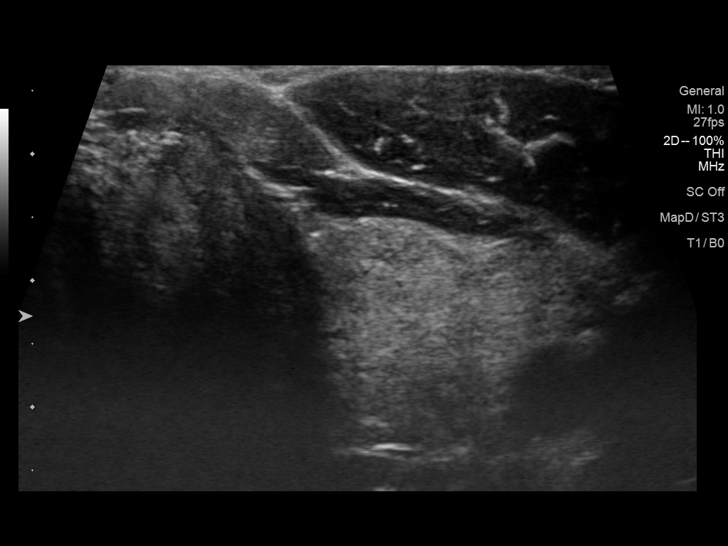
[im 38/38]
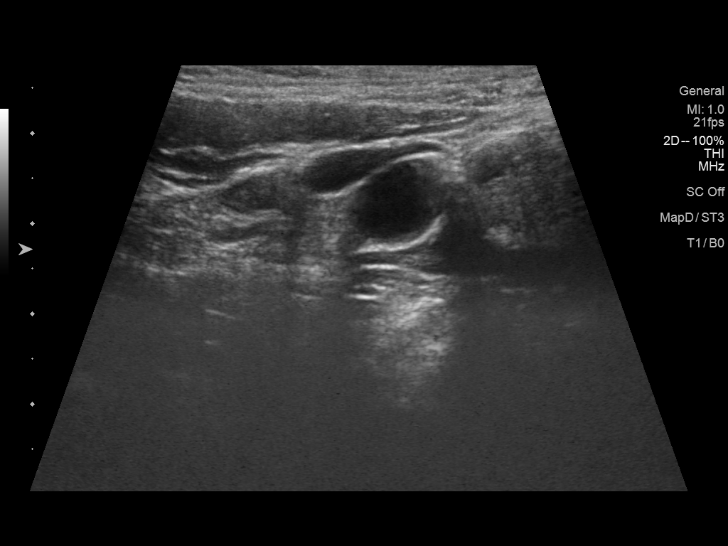

[13 of 25 positions shown; findings below may reference images not displayed]

FINDINGS: Parenchymal Echotexture: Moderately heterogenous

Isthmus: 0.3 cm thickness, previously

Right lobe: 9.1 x 4.4.9 cm, previously 8.8 x 3.8 x

Left lobe: 6.2 x 2 x 2.3 cm, previously 6.4 x 2 x

_________________________________________________________

Estimated total number of nodules >/= 1 cm: 2

Number of spongiform nodules >/=  2 cm not described below (TR1): 0

Number of mixed cystic and solid nodules >/= 1.5 cm not described
below (TR2): 0

_________________________________________________________

Nodule # 1: 7.4 x 5.4 x 3.7 cm mid right nodule, previously 7.4 x
5.4 x 3.3; this was previously biopsied.

Nodule # 2:

Prior biopsy: No

Location: Left; Inferior

Maximum size: 0.7 cm; Other 2 dimensions: 0.7 x 0.5 cm, previously,
1.6 x 1.4 x 0.9 cm

Composition: solid/almost completely solid (2)

Echogenicity: hypoechoic (2)

Shape: not taller-than-wide (0)

Margins: ill-defined (0)

Echogenic foci: none (0)

ACR TI-RADS total points: 4.

ACR TI-RADS risk category:  TR4 (4-6 points).

Significant change in size (>/= 20% in two dimensions and minimal
increase of 2 mm): No

Change in features: No

Change in ACR TI-RADS risk category: No

ACR TI-RADS recommendations:

Given size (<0.9 cm) and appearance, this nodule does NOT meet
TI-RADS criteria for biopsy or dedicated follow-up.
IMPRESSION: 1. Thyromegaly with bilateral nodules.
Neither currently meets criteria for biopsy or dedicated imaging
follow-up.

The above is in keeping with the ACR TI-RADS recommendations - [HOSPITAL] 1242;[DATE].

## 2020-03-02 DIAGNOSIS — U071 COVID-19: Secondary | ICD-10-CM | POA: Diagnosis not present

## 2020-07-31 DIAGNOSIS — E785 Hyperlipidemia, unspecified: Secondary | ICD-10-CM | POA: Diagnosis not present

## 2020-07-31 DIAGNOSIS — Z125 Encounter for screening for malignant neoplasm of prostate: Secondary | ICD-10-CM | POA: Diagnosis not present

## 2020-07-31 DIAGNOSIS — Z0000 Encounter for general adult medical examination without abnormal findings: Secondary | ICD-10-CM | POA: Diagnosis not present

## 2020-08-06 DIAGNOSIS — E785 Hyperlipidemia, unspecified: Secondary | ICD-10-CM | POA: Diagnosis not present

## 2020-08-06 DIAGNOSIS — Z1212 Encounter for screening for malignant neoplasm of rectum: Secondary | ICD-10-CM | POA: Diagnosis not present

## 2020-08-06 DIAGNOSIS — Z0000 Encounter for general adult medical examination without abnormal findings: Secondary | ICD-10-CM | POA: Diagnosis not present

## 2021-06-04 DIAGNOSIS — H53143 Visual discomfort, bilateral: Secondary | ICD-10-CM | POA: Diagnosis not present

## 2021-09-03 DIAGNOSIS — E785 Hyperlipidemia, unspecified: Secondary | ICD-10-CM | POA: Diagnosis not present

## 2021-09-03 DIAGNOSIS — Z125 Encounter for screening for malignant neoplasm of prostate: Secondary | ICD-10-CM | POA: Diagnosis not present

## 2021-09-10 DIAGNOSIS — E041 Nontoxic single thyroid nodule: Secondary | ICD-10-CM | POA: Diagnosis not present

## 2021-09-10 DIAGNOSIS — Z1331 Encounter for screening for depression: Secondary | ICD-10-CM | POA: Diagnosis not present

## 2021-09-10 DIAGNOSIS — R7301 Impaired fasting glucose: Secondary | ICD-10-CM | POA: Diagnosis not present

## 2021-09-10 DIAGNOSIS — Z1339 Encounter for screening examination for other mental health and behavioral disorders: Secondary | ICD-10-CM | POA: Diagnosis not present

## 2021-09-10 DIAGNOSIS — R82998 Other abnormal findings in urine: Secondary | ICD-10-CM | POA: Diagnosis not present

## 2021-09-10 DIAGNOSIS — Z0000 Encounter for general adult medical examination without abnormal findings: Secondary | ICD-10-CM | POA: Diagnosis not present

## 2022-05-23 DIAGNOSIS — E041 Nontoxic single thyroid nodule: Secondary | ICD-10-CM | POA: Diagnosis not present

## 2022-05-23 DIAGNOSIS — R0609 Other forms of dyspnea: Secondary | ICD-10-CM | POA: Diagnosis not present

## 2022-06-13 ENCOUNTER — Other Ambulatory Visit: Payer: Self-pay | Admitting: Adult Health

## 2022-06-13 ENCOUNTER — Ambulatory Visit
Admission: RE | Admit: 2022-06-13 | Discharge: 2022-06-13 | Disposition: A | Discharge status: Home / Self Care | Payer: BC Managed Care – PPO | Source: Ambulatory Visit | Attending: Adult Health | Admitting: Adult Health

## 2022-06-13 DIAGNOSIS — J9859 Other diseases of mediastinum, not elsewhere classified: Secondary | ICD-10-CM

## 2022-06-13 DIAGNOSIS — J9811 Atelectasis: Secondary | ICD-10-CM | POA: Diagnosis not present

## 2022-06-13 DIAGNOSIS — R222 Localized swelling, mass and lump, trunk: Secondary | ICD-10-CM | POA: Diagnosis not present

## 2022-06-13 DIAGNOSIS — I2510 Atherosclerotic heart disease of native coronary artery without angina pectoris: Secondary | ICD-10-CM | POA: Diagnosis not present

## 2022-06-13 DIAGNOSIS — M5134 Other intervertebral disc degeneration, thoracic region: Secondary | ICD-10-CM | POA: Diagnosis not present

## 2022-06-13 MED ORDER — IOPAMIDOL (ISOVUE-300) INJECTION 61%
75.0000 mL | Freq: Once | INTRAVENOUS | Status: AC | PRN
  Administered 2022-06-13: 75 mL via INTRAVENOUS

## 2022-10-17 DIAGNOSIS — R7301 Impaired fasting glucose: Secondary | ICD-10-CM | POA: Diagnosis not present

## 2022-10-17 DIAGNOSIS — E785 Hyperlipidemia, unspecified: Secondary | ICD-10-CM | POA: Diagnosis not present

## 2022-10-17 DIAGNOSIS — E041 Nontoxic single thyroid nodule: Secondary | ICD-10-CM | POA: Diagnosis not present

## 2022-10-17 DIAGNOSIS — N529 Male erectile dysfunction, unspecified: Secondary | ICD-10-CM | POA: Diagnosis not present

## 2022-10-17 DIAGNOSIS — R7989 Other specified abnormal findings of blood chemistry: Secondary | ICD-10-CM | POA: Diagnosis not present

## 2022-10-17 DIAGNOSIS — Z125 Encounter for screening for malignant neoplasm of prostate: Secondary | ICD-10-CM | POA: Diagnosis not present

## 2022-10-24 DIAGNOSIS — Z1339 Encounter for screening examination for other mental health and behavioral disorders: Secondary | ICD-10-CM | POA: Diagnosis not present

## 2022-10-24 DIAGNOSIS — E785 Hyperlipidemia, unspecified: Secondary | ICD-10-CM | POA: Diagnosis not present

## 2022-10-24 DIAGNOSIS — R82998 Other abnormal findings in urine: Secondary | ICD-10-CM | POA: Diagnosis not present

## 2022-10-24 DIAGNOSIS — R7301 Impaired fasting glucose: Secondary | ICD-10-CM | POA: Diagnosis not present

## 2022-10-24 DIAGNOSIS — Z1331 Encounter for screening for depression: Secondary | ICD-10-CM | POA: Diagnosis not present

## 2022-10-24 DIAGNOSIS — K760 Fatty (change of) liver, not elsewhere classified: Secondary | ICD-10-CM | POA: Diagnosis not present

## 2022-10-24 DIAGNOSIS — Z0000 Encounter for general adult medical examination without abnormal findings: Secondary | ICD-10-CM | POA: Diagnosis not present

## 2022-10-24 DIAGNOSIS — E041 Nontoxic single thyroid nodule: Secondary | ICD-10-CM | POA: Diagnosis not present

## 2023-07-26 DIAGNOSIS — D2371 Other benign neoplasm of skin of right lower limb, including hip: Secondary | ICD-10-CM | POA: Diagnosis not present

## 2023-07-26 DIAGNOSIS — L821 Other seborrheic keratosis: Secondary | ICD-10-CM | POA: Diagnosis not present

## 2023-07-26 DIAGNOSIS — L814 Other melanin hyperpigmentation: Secondary | ICD-10-CM | POA: Diagnosis not present

## 2023-07-26 DIAGNOSIS — D225 Melanocytic nevi of trunk: Secondary | ICD-10-CM | POA: Diagnosis not present

## 2023-07-26 DIAGNOSIS — L570 Actinic keratosis: Secondary | ICD-10-CM | POA: Diagnosis not present

## 2023-09-25 DIAGNOSIS — E041 Nontoxic single thyroid nodule: Secondary | ICD-10-CM | POA: Diagnosis not present

## 2023-10-25 DIAGNOSIS — H53143 Visual discomfort, bilateral: Secondary | ICD-10-CM | POA: Diagnosis not present

## 2024-01-15 ENCOUNTER — Ambulatory Visit: Attending: Internal Medicine | Admitting: Audiology

## 2024-01-15 DIAGNOSIS — H903 Sensorineural hearing loss, bilateral: Secondary | ICD-10-CM | POA: Diagnosis not present

## 2024-01-15 NOTE — Procedures (Signed)
  Outpatient Audiology and [facility] [address] [place], [place]  [address] [phone]  AUDIOLOGICAL  EVALUATION  NAME: Rick Smith     DOB:   1966-02-29      MRN: [id number]                                                                                     DATE: 01/15/2024     REFERENT: Loreli Elsie JONETTA Mickey., MD STATUS: Outpatient DIAGNOSIS: sensorineural hearing loss, bilateral    History: Rick Smith was seen for an audiological evaluation due to concerns regarding his hearing sensitivity. Rick Smith reports decreased hearing sensitivity occurring for many years. Rick Smith reports intermittent tinnitus. He denies otalgia, aural fullness, and dizziness. Rick Smith reports increased difficulty hearing and communicating with his wife and hearing in noisy restaurants. Easter reports a history of noise exposure from firearms and machinery.   Evaluation:  Otoscopy showed a clear view of the tympanic membranes, bilaterally Tympanometry results were consistent with normal middle ear pressure and normal tympanic membrane mobility (Type A), bilaterally.  Audiometric testing was completed using Conventional Audiometry techniques with insert earphones and TDH headphones. Test results are consistent with a low frequency hearing loss at 250 Hz, rising to normal hearing sensitivity at [phone] Hz sloping to a moderate high frequency hearing loss in the right ear and sloping to a severe high frequency hearing loss int he left ear. Sensorineural asymmetry noted at 3000-8000 Hz, worse in the left ear.  Speech Recognition Thresholds were obtained at 15 dB HL in the right ear and at 15  dB HL in the left ear. Word Recognition Testing was completed at 70 dB HL and Rick Smith scored 100% in the right ear and 84% in the left ear. Asymmetric word recognition noted, worse in the left ear.    Results:  The test results were reviewed with [facility]. Today's test results are consistent with a low frequency hearing loss at 250  Hz, rising to normal hearing sensitivity at [phone] Hz sloping to a moderate high frequency hearing loss in the right ear and sloping to a severe high frequency hearing loss int he left ear. Sensorineural asymmetry noted at 3000-8000 Hz, worse in the left ear. Rick Smith will have hearing and communication difficulty in adverse listening environments. He will benefit from the use of good communication strategies and the use of hearing aids if motivated. The use of hearing aids was briefly discussed.  A referral to an Ear, Nose, and Throat Physician was reviewed due to asymmetric hearing loss.   Recommendations: 1.   Referral to an Ear, Nose, and Throat Physician for asymmetric sensorineural hearing loss and asymmetric word recognition.  2.   Monitor hearing sensitivity. Return in 2 years for an audiological evaluation.    30 minutes spent testing and counseling on results.   If you have any questions please feel free to contact me at [phone].  Darryle Posey Audiologist, Au.D., CCC-A 01/15/2024  2:12 PM  Cc: Loreli Elsie JONETTA Mickey., MD

## 2024-02-21 DIAGNOSIS — E785 Hyperlipidemia, unspecified: Secondary | ICD-10-CM | POA: Diagnosis not present

## 2024-02-21 DIAGNOSIS — R7301 Impaired fasting glucose: Secondary | ICD-10-CM | POA: Diagnosis not present

## 2024-02-21 DIAGNOSIS — Z125 Encounter for screening for malignant neoplasm of prostate: Secondary | ICD-10-CM | POA: Diagnosis not present

## 2024-02-21 DIAGNOSIS — E049 Nontoxic goiter, unspecified: Secondary | ICD-10-CM | POA: Diagnosis not present

## 2024-02-28 DIAGNOSIS — E041 Nontoxic single thyroid nodule: Secondary | ICD-10-CM | POA: Diagnosis not present

## 2024-02-28 DIAGNOSIS — Z1331 Encounter for screening for depression: Secondary | ICD-10-CM | POA: Diagnosis not present

## 2024-02-28 DIAGNOSIS — R82998 Other abnormal findings in urine: Secondary | ICD-10-CM | POA: Diagnosis not present

## 2024-02-28 DIAGNOSIS — Z0000 Encounter for general adult medical examination without abnormal findings: Secondary | ICD-10-CM | POA: Diagnosis not present

## 2024-02-28 DIAGNOSIS — Z1339 Encounter for screening examination for other mental health and behavioral disorders: Secondary | ICD-10-CM | POA: Diagnosis not present

## 2024-02-28 DIAGNOSIS — Z23 Encounter for immunization: Secondary | ICD-10-CM | POA: Diagnosis not present

## 2024-02-29 ENCOUNTER — Other Ambulatory Visit: Payer: Self-pay | Admitting: Internal Medicine

## 2024-02-29 DIAGNOSIS — E041 Nontoxic single thyroid nodule: Secondary | ICD-10-CM

## 2024-03-27 ENCOUNTER — Other Ambulatory Visit: Payer: Self-pay | Admitting: Surgery

## 2024-03-27 ENCOUNTER — Telehealth: Payer: Self-pay

## 2024-03-27 DIAGNOSIS — E048 Other specified nontoxic goiter: Secondary | ICD-10-CM | POA: Diagnosis not present

## 2024-03-27 DIAGNOSIS — E049 Nontoxic goiter, unspecified: Secondary | ICD-10-CM

## 2024-03-27 DIAGNOSIS — J398 Other specified diseases of upper respiratory tract: Secondary | ICD-10-CM | POA: Diagnosis not present

## 2024-03-27 NOTE — Telephone Encounter (Signed)
   Pre-operative Risk Assessment    Patient Name: Rick Smith  DOB: 1966-01-23 MRN: [id number]   Date of last office visit: NONE Date of next office visit: NONE   Request for Surgical Clearance    Procedure:  THYROIDECTOMY  Date of Surgery:  Clearance TBD                                Surgeon:  KRYSTAL SPINNER, MD Surgeon's Group or Practice Name:  [facility] Phone number:  [phone] Fax number:  [phone]   ATTN: BURNARD CRETE, LPN   Type of Clearance Requested:   - Medical    Type of Anesthesia:  General    Additional requests/questions:    Signed, Lucie DELENA Ku   03/27/2024, 3:42 PM

## 2024-03-27 NOTE — Telephone Encounter (Signed)
   Name: NICOLE HAFLEY  DOB: 1966/06/29  MRN: [id number]  Primary Cardiologist: None  Chart reviewed as part of pre-operative protocol coverage. Because of Broly Hatfield Beyl's past medical history and time since last visit, he will require an in-office visit in order to better assess preoperative cardiovascular risk.   Patient has not been seen by our practice in the past. I checked Care Everywhere and did not see that he was established with another practice. He will need a new patient appointment and pre-op clearance. He is not on anticoagulation or antiplatelet therapy on review of medications in Epic.     Lamarr Satterfield, NP  03/27/2024, 3:55 PM

## 2024-03-27 NOTE — Telephone Encounter (Signed)
 Tried to call the pt to schedule a new pt appt for preop clearance. Phone just rang, no vm came on.

## 2024-03-27 NOTE — Telephone Encounter (Signed)
 Message was sent to our scheduling team to help schedule new patient appointment

## 2024-03-28 NOTE — Telephone Encounter (Signed)
 Appointment scheduled for 04/03/2024 @ 10:45 with DOROTHA Lesches, MD

## 2024-04-02 ENCOUNTER — Ambulatory Visit
Admission: RE | Admit: 2024-04-02 | Discharge: 2024-04-02 | Disposition: A | Discharge status: Home / Self Care | Source: Ambulatory Visit | Attending: Surgery | Admitting: Surgery

## 2024-04-02 ENCOUNTER — Inpatient Hospital Stay
Admission: RE | Admit: 2024-04-02 | Discharge: 2024-04-02 | Disposition: A | Source: Ambulatory Visit | Attending: Surgery | Admitting: Surgery

## 2024-04-02 DIAGNOSIS — E049 Nontoxic goiter, unspecified: Secondary | ICD-10-CM

## 2024-04-02 DIAGNOSIS — J9809 Other diseases of bronchus, not elsewhere classified: Secondary | ICD-10-CM | POA: Diagnosis not present

## 2024-04-02 DIAGNOSIS — J398 Other specified diseases of upper respiratory tract: Secondary | ICD-10-CM

## 2024-04-02 DIAGNOSIS — S1982XA Other specified injuries of cervical trachea, initial encounter: Secondary | ICD-10-CM | POA: Diagnosis not present

## 2024-04-02 MED ORDER — IOPAMIDOL (ISOVUE-300) INJECTION 61%
100.0000 mL | Freq: Once | INTRAVENOUS | Status: AC | PRN
Start: 2024-04-02 — End: 2024-04-02
  Administered 2024-04-02: 100 mL via INTRAVENOUS

## 2024-04-03 ENCOUNTER — Encounter: Payer: Self-pay | Admitting: Cardiovascular Disease

## 2024-04-03 ENCOUNTER — Ambulatory Visit: Attending: Internal Medicine | Admitting: Cardiovascular Disease

## 2024-04-03 ENCOUNTER — Ambulatory Visit
Admission: RE | Admit: 2024-04-03 | Discharge: 2024-04-03 | Disposition: A | Discharge status: Home / Self Care | Source: Ambulatory Visit | Attending: Surgery | Admitting: Surgery

## 2024-04-03 VITALS — BP 118/74 | HR 64 | Ht 71.5 in | Wt 222.0 lb

## 2024-04-03 DIAGNOSIS — E049 Nontoxic goiter, unspecified: Secondary | ICD-10-CM

## 2024-04-03 DIAGNOSIS — E785 Hyperlipidemia, unspecified: Secondary | ICD-10-CM | POA: Insufficient documentation

## 2024-04-03 DIAGNOSIS — R931 Abnormal findings on diagnostic imaging of heart and coronary circulation: Secondary | ICD-10-CM | POA: Diagnosis not present

## 2024-04-03 DIAGNOSIS — E782 Mixed hyperlipidemia: Secondary | ICD-10-CM | POA: Diagnosis not present

## 2024-04-03 DIAGNOSIS — Z01818 Encounter for other preprocedural examination: Secondary | ICD-10-CM | POA: Insufficient documentation

## 2024-04-03 DIAGNOSIS — J398 Other specified diseases of upper respiratory tract: Secondary | ICD-10-CM

## 2024-04-03 DIAGNOSIS — Z8249 Family history of ischemic heart disease and other diseases of the circulatory system: Secondary | ICD-10-CM | POA: Diagnosis not present

## 2024-04-03 DIAGNOSIS — E042 Nontoxic multinodular goiter: Secondary | ICD-10-CM | POA: Diagnosis not present

## 2024-04-03 NOTE — Progress Notes (Signed)
 04/03/2024 Marcey MALVA Georgi   July 30, 1965  [id number]  Primary Physician Loreli Elsie JONETTA Mickey., MD Primary Cardiologist: Dorn JINNY Lesches MD [place] [place], [place]  HPI:  Rick Smith is a 58 y.o. 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  He was referred by Dr. Eletha, his general surgeon, for preoperative clearance before elective thyroidectomy.  His risk factors include treated hyperlipidemia on atorvastatin 3 times a week though he appears to be somewhat intolerant to statin therapy.  His father did have stents in his 79s.  He is never had an attack or stroke.  He does complain some mild dyspnea with denies chest pain.  He works out 2 times a week.  He has no other significant medical problems.  He did have a coronary calcium score performed 08/02/2018 that was 1.3.   Current Meds  Medication Sig   atorvastatin (LIPITOR) 20 MG tablet Take 20 mg by mouth daily. (Patient taking differently: Take 20 mg by mouth. 3 days weekly)   sildenafil (VIAGRA) 100 MG tablet 1/2-1 pill as needed Orally Once a day     No Known Allergies  Social History   Socioeconomic History   Marital status: Married    Spouse name: Not on file   Number of children: Not on file   Years of education: Not on file   Highest education level: Not on file  Occupational History   Not on file  Tobacco Use   Smoking status: Never   Smokeless tobacco: Current    Types: Chew  Substance and Sexual Activity   Alcohol  use: Yes    Comment: occ   Drug use: No   Sexual activity: Not on file  Other Topics Concern   Not on file  Social History Narrative   Not on file   Social Drivers of Health   Financial Resource Strain: Not on file  Food Insecurity: Not on file  Transportation Needs: Not on file  Physical Activity: Not on file  Stress: Not on file   Social Connections: Not on file  Intimate Partner Violence: Not on file     Review of Systems: General: negative for chills, fever, night sweats or weight changes.  Cardiovascular: negative for chest pain, dyspnea on exertion, edema, orthopnea, palpitations, paroxysmal nocturnal dyspnea or shortness of breath Dermatological: negative for rash Respiratory: negative for cough or wheezing Urologic: negative for hematuria Abdominal: negative for nausea, vomiting, diarrhea, bright red blood per rectum, melena, or hematemesis Neurologic: negative for visual changes, syncope, or dizziness All other systems reviewed and are otherwise negative except as noted above.    Blood pressure 118/74, pulse 64, height 5' 11.5 (1.816 m), weight 222 lb (100.7 kg), SpO2 97%.  General appearance: alert and no distress Neck: no adenopathy, no carotid bruit, no JVD, supple, symmetrical, trachea midline, and thyroid  not enlarged, symmetric, no tenderness/mass/nodules Lungs: clear to auscultation bilaterally Heart: regular rate and rhythm, S1, S2 normal, no murmur, click, rub or gallop Extremities: extremities normal, atraumatic, no cyanosis or edema Pulses: 2+ and symmetric Skin: Skin color, texture, turgor normal. No rashes or lesions Neurologic: Grossly normal  EKG EKG Interpretation Date/Time:  Wednesday April 03 2024 10:35:46 EDT Ventricular Rate:  64 PR Interval:  152 QRS Duration:  80 QT Interval:  404 QTC Calculation: 416 R Axis:  14  Text Interpretation: Normal sinus rhythm Normal ECG No previous ECGs available Confirmed by Court Carrier [phone]) on 04/03/2024 10:55:34 AM    ASSESSMENT AND PLAN:   Hyperlipidemia History of hyperlipidemia intolerant to statin therapy on atorvastatin 3 times a week with lipid profile performed by his PCP 02/21/2024 revealing a total cholesterol 135, LDL of 86 and HDL of 40.  His coronary calcium score performed 08/02/2018 was 1.3.  We will recheck a coronary  calcium score to restratify.  Since he does appear to be intolerant to statin drugs I am referring him to a Pharm.D. to discuss PCSK9.  Family history of heart disease Father had stents in his 56s.  Preoperative clearance Patient scheduled for thyroidectomy by Dr. Eletha  depending on cardiac clearance.  The patient is active and asymptomatic except for some mild dyspnea on exertion.  He denies chest pain.  I am going to get a 2D echo and repeat his coronary calcium score.  If these are unremarkable we will clear him for his upcoming surgery.  Elevated coronary artery calcium score Coronary calcium score performed by his PCP 08/02/2018 was 1.3.  Given his upcoming surgery will repeat a coronary calcium score in addition to needing to know how aggressive be with lipid lowering.     Carrier DOROTHA Court MD FACP,FACC,FAHA, [facility] 04/03/2024 11:11 AM

## 2024-04-03 NOTE — Assessment & Plan Note (Signed)
 History of hyperlipidemia intolerant to statin therapy on atorvastatin 3 times a week with lipid profile performed by his PCP 02/21/2024 revealing a total cholesterol 135, LDL of 86 and HDL of 40.  His coronary calcium score performed 08/02/2018 was 1.3.  We will recheck a coronary calcium score to restratify.  Since he does appear to be intolerant to statin drugs I am referring him to a Pharm.D. to discuss PCSK9.

## 2024-04-03 NOTE — Assessment & Plan Note (Signed)
 Patient scheduled for thyroidectomy by Dr. Eletha  depending on cardiac clearance.  The patient is active and asymptomatic except for some mild dyspnea on exertion.  He denies chest pain.  I am going to get a 2D echo and repeat his coronary calcium score.  If these are unremarkable we will clear him for his upcoming surgery.

## 2024-04-03 NOTE — Assessment & Plan Note (Signed)
 Father had stents in his 66s.

## 2024-04-03 NOTE — Assessment & Plan Note (Signed)
 Coronary calcium score performed by his PCP 08/02/2018 was 1.3.  Given his upcoming surgery will repeat a coronary calcium score in addition to needing to know how aggressive be with lipid lowering.

## 2024-04-03 NOTE — Patient Instructions (Signed)
 Medication Instructions:  Your physician recommends that you continue on your current medications as directed. Please refer to the Current Medication list given to you today.  *If you need a refill on your cardiac medications before your next appointment, please call your pharmacy*   Testing/Procedures: Your physician has requested that you have an echocardiogram. Echocardiography is a painless test that uses sound waves to create images of your heart. It provides your doctor with information about the size and shape of your heart and how well your heart's chambers and valves are working. This procedure takes approximately one hour. There are no restrictions for this procedure. Please do NOT wear cologne, perfume, aftershave, or lotions (deodorant is allowed). Please arrive 15 minutes prior to your appointment time.  Please note: We ask at that you not bring children with you during ultrasound (echo/ vascular) testing. Due to room size and safety concerns, children are not allowed in the ultrasound rooms during exams. Our front office staff cannot provide observation of children in our lobby area while testing is being conducted. An adult accompanying a patient to their appointment will only be allowed in the ultrasound room at the discretion of the ultrasound technician under special circumstances. We apologize for any inconvenience.    Dr. Court has ordered a CT coronary calcium score.   Test locations:  [facility] at [facility] [place]  [facility] at [facility]  This is $99 out of pocket.   Coronary CalciumScan A coronary calcium scan is an imaging test used to look for deposits of calcium and other fatty materials (plaques) in the inner lining of the blood vessels of the heart (coronary arteries). These deposits of calcium and plaques can partly clog and narrow the coronary arteries without  producing any symptoms or warning signs. This puts a person at risk for a heart attack. This test can detect these deposits before symptoms develop. Tell a health care provider about: Any allergies you have. All medicines you are taking, including vitamins, herbs, eye drops, creams, and over-the-counter medicines. Any problems you or family members have had with anesthetic medicines. Any blood disorders you have. Any surgeries you have had. Any medical conditions you have. Whether you are pregnant or may be pregnant. What are the risks? Generally, this is a safe procedure. However, problems may occur, including: Harm to a pregnant woman and her unborn baby. This test involves the use of radiation. Radiation exposure can be dangerous to a pregnant woman and her unborn baby. If you are pregnant, you generally should not have this procedure done. Slight increase in the risk of cancer. This is because of the radiation involved in the test. What happens before the procedure? No preparation is needed for this procedure. What happens during the procedure? You will undress and remove any jewelry around your neck or chest. You will put on a hospital gown. Sticky electrodes will be placed on your chest. The electrodes will be connected to an electrocardiogram (ECG) machine to record a tracing of the electrical activity of your heart. A CT scanner will take pictures of your heart. During this time, you will be asked to lie still and hold your breath for 2-3 seconds while a picture of your heart is being taken. The procedure may vary among health care providers and hospitals. What happens after the procedure? You can get dressed. You can return to your normal activities. It is up to you  to get the results of your test. Ask your health care provider, or the department that is doing the test, when your results will be ready. Summary A coronary calcium scan is an imaging test used to look for deposits of  calcium and other fatty materials (plaques) in the inner lining of the blood vessels of the heart (coronary arteries). Generally, this is a safe procedure. Tell your health care provider if you are pregnant or may be pregnant. No preparation is needed for this procedure. A CT scanner will take pictures of your heart. You can return to your normal activities after the scan is done. This information is not intended to replace advice given to you by your health care provider. Make sure you discuss any questions you have with your health care provider. Document Released: 12/17/2007 Document Revised: 05/09/2016 Document Reviewed: 05/09/2016 Elsevier Interactive Patient Education  2017 [employer].   Follow-Up: At [facility], you and your health needs are our priority.  As part of our continuing mission to provide you with exceptional heart care, our providers are all part of one team.  This team includes your primary Cardiologist (physician) and Advanced Practice Providers or APPs (Physician Assistants and Nurse Practitioners) who all work together to provide you with the care you need, when you need it.  Your next appointment:   6 month(s)  Provider:   Dorn Lesches, MD    We recommend signing up for the patient portal called MyChart.  Sign up information is provided on this After Visit Summary.  MyChart is used to connect with patients for Virtual Visits (Telemedicine).  Patients are able to view lab/test results, encounter notes, upcoming appointments, etc.  Non-urgent messages can be sent to your provider as well.   To learn more about what you can do with MyChart, go to [email or web address].   Other Instructions We will schedule you an appointment to see a clinical PharmD to further discuss cholesterol therapy.

## 2024-04-10 ENCOUNTER — Ambulatory Visit (HOSPITAL_COMMUNITY)
Admission: RE | Admit: 2024-04-10 | Discharge: 2024-04-10 | Disposition: A | Discharge status: Home / Self Care | Payer: Self-pay | Source: Ambulatory Visit | Attending: Cardiology | Admitting: Cardiology

## 2024-04-10 DIAGNOSIS — Z136 Encounter for screening for cardiovascular disorders: Secondary | ICD-10-CM | POA: Diagnosis not present

## 2024-04-10 DIAGNOSIS — E782 Mixed hyperlipidemia: Secondary | ICD-10-CM | POA: Insufficient documentation

## 2024-04-10 DIAGNOSIS — R931 Abnormal findings on diagnostic imaging of heart and coronary circulation: Secondary | ICD-10-CM | POA: Insufficient documentation

## 2024-04-10 DIAGNOSIS — Z01818 Encounter for other preprocedural examination: Secondary | ICD-10-CM | POA: Insufficient documentation

## 2024-04-10 DIAGNOSIS — Z8249 Family history of ischemic heart disease and other diseases of the circulatory system: Secondary | ICD-10-CM | POA: Insufficient documentation

## 2024-04-11 ENCOUNTER — Ambulatory Visit: Payer: Self-pay | Admitting: Cardiovascular Disease

## 2024-04-22 ENCOUNTER — Telehealth: Payer: Self-pay | Admitting: Surgery

## 2024-04-22 NOTE — Telephone Encounter (Signed)
 Telephone call to patient regarding status of cardiology evaluation and planning for thyroid  surgery.  Reviewed results of thyroid  USN and CT scans of neck and chest.  No new or worrisome findings.  Stable thyroid  goiter with tracheal deviation without significant narrowing.  Minimal extension into chest.  Will plan total thyroidectomy through a cervical approach.  Cardiac scan and echocardiogram completed.  Awaiting cardiac clearance for surgery from Dr. Dorn Lesches.  Once clearance received, will proceed with scheduling of surgery and anesthesia evaluation.  Krystal Spinner, MD [facility] A [facility] practice Office: [phone]

## 2024-04-23 NOTE — Telephone Encounter (Signed)
 Message received from Dr. Court regarding scheduling of echocardiogram for pre-op clearance for thyroid  surgery. Spoke with pt regarding open slot for echo tomorrow at 10am. Pt is able to make this appointment. Pt has no further questions at this time.

## 2024-04-24 ENCOUNTER — Ambulatory Visit (HOSPITAL_COMMUNITY)
Admission: RE | Admit: 2024-04-24 | Discharge: 2024-04-24 | Disposition: A | Discharge status: Home / Self Care | Source: Ambulatory Visit | Attending: Cardiovascular Disease | Admitting: Cardiovascular Disease

## 2024-04-24 DIAGNOSIS — Z01810 Encounter for preprocedural cardiovascular examination: Secondary | ICD-10-CM

## 2024-04-24 DIAGNOSIS — E782 Mixed hyperlipidemia: Secondary | ICD-10-CM | POA: Diagnosis not present

## 2024-04-24 DIAGNOSIS — Z8249 Family history of ischemic heart disease and other diseases of the circulatory system: Secondary | ICD-10-CM | POA: Diagnosis not present

## 2024-04-24 DIAGNOSIS — R931 Abnormal findings on diagnostic imaging of heart and coronary circulation: Secondary | ICD-10-CM | POA: Insufficient documentation

## 2024-04-24 DIAGNOSIS — Z01818 Encounter for other preprocedural examination: Secondary | ICD-10-CM | POA: Diagnosis not present

## 2024-04-24 LAB — ECHOCARDIOGRAM COMPLETE
AR max vel: 2.9 cm2
AV Area VTI: 3.12 cm2
AV Area mean vel: 2.97 cm2
AV Mean grad: 3 mmHg
AV Peak grad: 5.1 mmHg
Ao pk vel: 1.13 m/s
Area-P 1/2: 3.85 cm2
S' Lateral: 2.64 cm

## 2024-05-06 NOTE — Progress Notes (Unsigned)
 Patient ID: Rick Smith                 DOB: Dec 24, 1965                    MRN: [id number]      HPI: Rick Smith is a 58 y.o. male patient referred to lipid clinic by [facility]. PMH is significant for HLD, family hx of CAD, CAD - elevated CAC score   on atorvastatin 3 times a week though he appears to be somewhat intolerant to statin therapy. His father did have stents in his 35s. He is never had an attack or stroke. Recent CAC score 60.1. Reviewed options for lowering LDL cholesterol, including ezetimibe, PCSK-9 inhibitors, bempedoic acid and inclisiran.  Discussed mechanisms of action, dosing, side effects and potential decreases in LDL cholesterol.  Also reviewed cost information and potential options for patient assistance.  Current Medications:  Intolerances:  Risk Factors: CAD, CAC score 71st percentile,  LDL goal:  Last lipid lab  Diet:   Exercise:   Family History:   Social History:   Labs:  Lipid Panel  No results found for: CHOL, TRIG, HDL, CHOLHDL, VLDL, LDLCALC, LDLDIRECT, LABVLDL  Past Medical History:  Diagnosis Date   Coronary artery calcification     Current Outpatient Medications on File Prior to Visit  Medication Sig Dispense Refill   atorvastatin (LIPITOR) 20 MG tablet Take 20 mg by mouth daily. (Patient taking differently: Take 20 mg by mouth. 3 days weekly)     sildenafil (VIAGRA) 100 MG tablet 1/2-1 pill as needed Orally Once a day     No current facility-administered medications on file prior to visit.    No Known Allergies  Assessment/Plan:  1. Hyperlipidemia -  No problems updated. No problem-specific Assessment & Plan notes found for this encounter.    Thank you,  Rick Smith, Pharm.D [place] Rick Smith. [facility] [address], [place], [place] [address] Phone: [phone]; Fax: [phone]

## 2024-05-07 ENCOUNTER — Encounter: Payer: Self-pay | Admitting: Pharmacist

## 2024-05-07 ENCOUNTER — Ambulatory Visit: Attending: Cardiology | Admitting: Pharmacist

## 2024-05-07 DIAGNOSIS — E7849 Other hyperlipidemia: Secondary | ICD-10-CM

## 2024-05-07 NOTE — Assessment & Plan Note (Signed)
 Assessment and plan  Unable to tolerate Atorvastatin 20 mg 3 times per week due to feet discomfort. Advised to reduce dose to 10 gm 3 times per week . Will get updated lipid lab as last lab was 3 months ago and patient is on and off of the statins in past 3 months. Reviewed next available option ( Zetia, PCSK9i and Nexletol)  Depends on updated LDLc level patient is willing to try any of the discussed options.

## 2024-05-08 DIAGNOSIS — E7849 Other hyperlipidemia: Secondary | ICD-10-CM | POA: Diagnosis not present

## 2024-05-09 LAB — LIPOPROTEIN A (LPA): Lipoprotein (a): 176.9 nmol/L — AB (ref <75.0)

## 2024-05-09 LAB — LIPID PANEL
Chol/HDL Ratio: 3.7 ratio (ref 0.0–5.0)
Cholesterol, Total: 188 mg/dL (ref 100–199)
HDL: 51 mg/dL (ref >39)
LDL Chol Calc (NIH): 126 mg/dL — ABNORMAL HIGH (ref 0–99)
Triglycerides: 57 mg/dL (ref 0–149)
VLDL Cholesterol Cal: 11 mg/dL (ref 5–40)

## 2024-05-10 ENCOUNTER — Other Ambulatory Visit (HOSPITAL_COMMUNITY): Payer: Self-pay

## 2024-05-10 ENCOUNTER — Telehealth: Payer: Self-pay | Admitting: Pharmacy Technician

## 2024-05-10 ENCOUNTER — Telehealth: Payer: Self-pay | Admitting: Pharmacist

## 2024-05-10 DIAGNOSIS — E782 Mixed hyperlipidemia: Secondary | ICD-10-CM

## 2024-05-10 MED ORDER — REPATHA SURECLICK 140 MG/ML ~~LOC~~ SOAJ: 140.0000 mg | SUBCUTANEOUS | 3 refills | Status: AC

## 2024-05-10 NOTE — Addendum Note (Signed)
 Addended by: Theodus Ran K on: 05/10/2024 04:31 PM   Modules accepted: Orders

## 2024-05-10 NOTE — Telephone Encounter (Signed)
 PA for Repatha pending see other encounter

## 2024-05-10 NOTE — Telephone Encounter (Signed)
 Pharmacy Patient Advocate Encounter  Received notification from [facility] that Prior Authorization for repatha has been APPROVED from 05/10/24 to 05/10/27. Ran test claim, Copay is $24.99. This test claim was processed through [facility]- copay amounts may vary at other pharmacies due to pharmacy/plan contracts, or as the patient moves through the different stages of their insurance plan.   PA #/Case ID/Reference #: [id number]

## 2024-05-10 NOTE — Telephone Encounter (Signed)
   Pharmacy Patient Advocate Encounter   Received notification from Pt Calls Messages that prior authorization for repatha is required/requested.   Insurance verification completed.   The patient is insured through [facility].   Per test claim: PA required; PA submitted to above mentioned insurance via Latent Key/confirmation #/EOC [id number] Status is pending

## 2024-05-13 ENCOUNTER — Ambulatory Visit: Payer: Self-pay | Admitting: Pharmacist

## 2024-05-14 ENCOUNTER — Ambulatory Visit: Payer: Self-pay | Admitting: Surgery

## 2024-05-14 NOTE — H&P (Signed)
 REFERRING PHYSICIAN:  Loreli Elsie Berg, MD   PROVIDER:  Jill Stopka OZELL SPINNER, MD     Chief Complaint: New Consultation (Substernal thyroid  goiter)   History of Present Illness:   Patient is referred by Dr. Elsie Berg Loreli for surgical evaluation and recommendations regarding an enlarging substernal thyroid  goiter with tracheal deviation.  Patient had been noted with a dominant right thyroid  nodule several years ago.  His most recent imaging includes a CT scan of the chest from 2023 and an ultrasound of the thyroid  from 2021.  Patient has a dominant mass in the right thyroid  lobe which occupies most of the lobe and causes tracheal deviation to the left.  There is a subcentimeter nodule on the left side.  Patient reportedly underwent fine-needle aspiration biopsy of the dominant mass with benign cytopathology.  Thyroid  function testing has been normal with a recent TSH level of 1.34.  Patient does note dyspnea with exertion.  This occurs mainly when he is at the gym.  He denies any positional dyspnea.  He denies any dysphagia.  He denies any neck discomfort.  He does not have a globus sensation.  Patient has had no prior head or neck surgery.  There is no significant family history of thyroid  disease.  Patient has never been on thyroid  medication.  Patient works in optician, dispensing.   Patient is also concerned about cardiovascular risk.  Apparently he underwent a coronary artery CT scan as well as the chest CT scan, both of which showed calcification in his LAD.  Patient has been trying to obtain consultation with cardiology.     Review of Systems: A complete review of systems was obtained from the patient.  I have reviewed this information and discussed as appropriate with the patient.  See HPI as well for other ROS.   Review of Systems  Constitutional: Negative.   HENT: Negative.    Eyes: Negative.   Respiratory:  Positive for shortness of breath.   Cardiovascular:  Negative.   Gastrointestinal: Negative.   Genitourinary: Negative.   Musculoskeletal: Negative.   Skin: Negative.   Neurological: Negative.   Endo/Heme/Allergies: Negative.   Psychiatric/Behavioral: Negative.          Medical History: Past Medical History  History reviewed. No pertinent past medical history.     Problem List     Patient Active Problem List  Diagnosis   Substernal thyroid  goiter   Tracheal deviation        Past Surgical History       Past Surgical History:  Procedure Laterality Date   HERNIA REPAIR            Allergies  No Known Allergies     Medications Ordered Prior to Encounter        Current Outpatient Medications on File Prior to Visit  Medication Sig Dispense Refill   atorvastatin (LIPITOR) 20 MG tablet          No current facility-administered medications on file prior to visit.        Family History       Family History  Problem Relation Age of Onset   High blood pressure (Hypertension) Father     Hyperlipidemia (Elevated cholesterol) Father     Heart valve disease Father          Tobacco Use History  Social History       Tobacco Use  Smoking Status Never  Smokeless Tobacco Never  Social History  Social History         Socioeconomic History   Marital status: Married  Tobacco Use   Smoking status: Never   Smokeless tobacco: Never  Vaping Use   Vaping status: Never Used  Substance and Sexual Activity   Alcohol  use: Yes      Alcohol /week: 1.0 - 2.0 standard drink of alcohol       Types: 1 - 2 Standard drinks or equivalent per week   Drug use: Never    Social Drivers of Health        Housing Stability: Unknown (03/27/2024)    Housing Stability Vital Sign     Homeless in the Last Year: No        Objective:         Vitals:     BP: 114/77  Pulse: 68  Temp: 36.6 C (97.9 F)  SpO2: 97%  Weight: (!) 101.2 kg (223 lb)  Height: 181.6 cm (5' 11.5)  PainSc: 0-No pain    Body mass index is 30.67  kg/m.   Physical Exam    GENERAL APPEARANCE Comfortable, no acute issues Development: normal Gross deformities: none   SKIN Rash, lesions, ulcers: none Induration, erythema: none Nodules: none palpable   EYES Conjunctiva and lids: normal Pupils: equal   EARS, NOSE, MOUTH, THROAT External ears: no lesion or deformity External nose: no lesion or deformity Hearing: grossly normal   NECK Symmetric: no Trachea: Deviation to the left, approximately 3 cm Thyroid : No palpable abnormality in the left thyroid  lobe.  Right thyroid  lobe is quite large, measuring about 9 cm in size, soft, nontender, extending beneath the right clavicle.   CHEST/CV Not assessed   ABDOMEN Not assessed   GENITOURINARY/RECTAL Not assessed   MUSCULOSKELETAL Station and gait: normal Digits and nails: no clubbing or cyanosis Muscle strength: grossly normal all extremities Deformity: none   LYMPHATIC Cervical: none palpable Supraclavicular: none palpable   PSYCHIATRIC Oriented to person, place, and time: yes Mood and affect: normal for situation Judgment and insight: appropriate for situation       Assessment and Plan:   Substernal thyroid  goiter Tracheal deviation   Patient is referred by his primary care physician for surgical evaluation and recommendations regarding a dominant right thyroid  nodule with substernal extension and tracheal deviation.   Patient provided with a copy of The Thyroid  Book: Medical and Surgical Treatment of Thyroid  Problems, published by [employer], 16 pages.  Book reviewed and explained to patient during visit today.   Today we reviewed his clinical history.  We reviewed his imaging studies from 2021 and 2023.  We reviewed his recent laboratory studies.  I showed him the images on my computer showing the dominant mass in the right lobe and the resulting tracheal deviation and extension into the mediastinum.   We discussed possible surgical resection.  I provided  him with written literature on thyroid  surgery to review at home.  I would like to obtain additional imaging prior to considering surgery.  We will obtain a CT scan of the neck and chest with contrast.  We will also obtain a new thyroid  ultrasound examination to better evaluate the left thyroid  lobe.  I will contact the patient with the results of the studies when they are available and we will discuss further management at that time.  We briefly discussed thyroid  surgery today.   We will also place a request for cardiac clearance.  Patient will need to be seen by cardiology prior to  any surgical intervention.   Patient understands and agrees to proceed with the above studies.  We will make arrangements and contact him with the results when they are available.   Krystal Spinner MD [facility] Surgery Office: [phone]

## 2024-05-15 ENCOUNTER — Other Ambulatory Visit (HOSPITAL_COMMUNITY)

## 2024-06-17 ENCOUNTER — Ambulatory Visit (HOSPITAL_COMMUNITY): Admit: 2024-06-17 | Admitting: Surgery

## 2024-06-17 SURGERY — LOBECTOMY, THYROID
Anesthesia: General | Laterality: Right

## 2024-10-07 ENCOUNTER — Ambulatory Visit: Admitting: Cardiovascular Disease
